# Patient Record
Sex: Female | Born: 1955
Health system: Southern US, Community
[De-identification: ages and names within clinical notes are randomized; demographics above are authoritative.]

## PROBLEM LIST (undated history)

## (undated) DIAGNOSIS — M8580 Other specified disorders of bone density and structure, unspecified site: Principal | ICD-10-CM

## (undated) DIAGNOSIS — I10 Essential (primary) hypertension: Secondary | ICD-10-CM

## (undated) DIAGNOSIS — R7303 Prediabetes: Secondary | ICD-10-CM

## (undated) HISTORY — DX: Essential (primary) hypertension: I10

## (undated) HISTORY — DX: Prediabetes: R73.03

## (undated) HISTORY — DX: Other specified disorders of bone density and structure, unspecified site: M85.80

---

## 1986-08-15 HISTORY — PX: TUBAL LIGATION: SHX77

## 2000-12-12 ENCOUNTER — Encounter: Payer: Self-pay | Admitting: Obstetrics and Gynecology

## 2000-12-12 ENCOUNTER — Ambulatory Visit (HOSPITAL_COMMUNITY): Admission: RE | Admit: 2000-12-12 | Discharge: 2000-12-12 | Payer: Self-pay | Admitting: Obstetrics and Gynecology

## 2004-01-28 ENCOUNTER — Ambulatory Visit (HOSPITAL_COMMUNITY): Admission: RE | Admit: 2004-01-28 | Discharge: 2004-01-28 | Payer: Self-pay | Admitting: Obstetrics and Gynecology

## 2005-03-30 ENCOUNTER — Ambulatory Visit (HOSPITAL_COMMUNITY): Admission: RE | Admit: 2005-03-30 | Discharge: 2005-03-30 | Payer: Self-pay | Admitting: Obstetrics and Gynecology

## 2006-05-29 ENCOUNTER — Ambulatory Visit (HOSPITAL_COMMUNITY): Admission: RE | Admit: 2006-05-29 | Discharge: 2006-05-29 | Payer: Self-pay | Admitting: Obstetrics and Gynecology

## 2007-06-27 ENCOUNTER — Ambulatory Visit (HOSPITAL_COMMUNITY): Admission: RE | Admit: 2007-06-27 | Discharge: 2007-06-27 | Payer: Self-pay | Admitting: Obstetrics and Gynecology

## 2012-03-19 ENCOUNTER — Other Ambulatory Visit (HOSPITAL_COMMUNITY): Payer: Self-pay | Admitting: Obstetrics and Gynecology

## 2012-03-19 DIAGNOSIS — Z1231 Encounter for screening mammogram for malignant neoplasm of breast: Secondary | ICD-10-CM

## 2012-04-11 ENCOUNTER — Ambulatory Visit (HOSPITAL_COMMUNITY)
Admission: RE | Admit: 2012-04-11 | Discharge: 2012-04-11 | Disposition: A | Discharge status: Home / Self Care | Payer: 59 | Source: Ambulatory Visit | Attending: Obstetrics and Gynecology | Admitting: Obstetrics and Gynecology

## 2012-04-11 DIAGNOSIS — Z1231 Encounter for screening mammogram for malignant neoplasm of breast: Secondary | ICD-10-CM | POA: Insufficient documentation

## 2013-08-24 ENCOUNTER — Encounter (HOSPITAL_BASED_OUTPATIENT_CLINIC_OR_DEPARTMENT_OTHER): Payer: Self-pay | Admitting: Emergency Medicine

## 2013-08-24 ENCOUNTER — Emergency Department (HOSPITAL_BASED_OUTPATIENT_CLINIC_OR_DEPARTMENT_OTHER)
Admission: EM | Admit: 2013-08-24 | Discharge: 2013-08-24 | Disposition: A | Discharge status: Home / Self Care | Payer: 59 | Attending: Emergency Medicine | Admitting: Emergency Medicine

## 2013-08-24 DIAGNOSIS — R51 Headache: Secondary | ICD-10-CM | POA: Insufficient documentation

## 2013-08-24 DIAGNOSIS — J019 Acute sinusitis, unspecified: Secondary | ICD-10-CM | POA: Insufficient documentation

## 2013-08-24 DIAGNOSIS — J3489 Other specified disorders of nose and nasal sinuses: Secondary | ICD-10-CM | POA: Insufficient documentation

## 2013-08-24 DIAGNOSIS — J329 Chronic sinusitis, unspecified: Secondary | ICD-10-CM

## 2013-08-24 DIAGNOSIS — F17200 Nicotine dependence, unspecified, uncomplicated: Secondary | ICD-10-CM | POA: Insufficient documentation

## 2013-08-24 MED ORDER — AMOXICILLIN-POT CLAVULANATE 875-125 MG PO TABS: 1.0000 | ORAL_TABLET | Freq: Two times a day (BID) | ORAL | Status: DC

## 2013-08-24 NOTE — Progress Notes (Signed)
Received Incoming call from [facility] .Pharmacist requesting MD clarification on medication directions.Upon reviewing EPIC it was noted [facility] had [facility] not [facility] ED. This CM placed a call to [facility]-and spoke with RN-Marva she was provided with information on the situation and contact number for [facility]. She will follow up with MD regarding Pharmacist  Question.This CM called Raquel Sarna pharmacist [phone] and explained patient had gone to [facility] and not [facility] ED.Pharmacist aware message passed along to [facility].No further CM needs identified.

## 2013-08-24 NOTE — ED Notes (Signed)
Patient c/o non=productive cough/headache & ear pain since Monday, voice scratchy. URI symptoms  throughout December but symptoms seemed to go away, but started feeing bad again on Monday. Has taken alkaseltzer, sudafed & mucinex but no relief.

## 2013-08-24 NOTE — ED Provider Notes (Signed)
CSN: [id number]     Arrival date & time 08/24/13  1120 History   First MD Initiated Contact with Patient 08/24/13 1139     Chief Complaint  Patient presents with  . Cough   (Consider location/radiation/quality/duration/timing/severity/associated sxs/prior Treatment) HPI Comments: 58 yo female with smoking hx presents with sinus congestion, ear pain and cough since Christmas.  Nothing improves, constant, tried sudafed, no current abx, no DM.  Mild drainage. No sick contacts. Pt has been going to work and tolerating po.  Mild HA frontal.   Patient is a 58 y.o. female presenting with cough. The history is provided by the patient.  Cough Associated symptoms: headaches (gradual onset) and rhinorrhea   Associated symptoms: no chills, no fever and no rash     History reviewed. No pertinent past medical history. History reviewed. No pertinent past surgical history. No family history on file. History  Substance Use Topics  . Smoking status: Current Every Day Smoker  . Smokeless tobacco: Not on file  . Alcohol Use: No   OB History   Grav Para Term Preterm Abortions TAB SAB Ect Mult Living                 Review of Systems  Constitutional: Negative for fever and chills.  HENT: Positive for congestion, rhinorrhea and sinus pressure.   Eyes: Negative for visual disturbance.  Respiratory: Positive for cough.   Gastrointestinal: Negative for vomiting.  Genitourinary: Negative for dysuria and flank pain.  Musculoskeletal: Negative for neck pain and neck stiffness.  Skin: Negative for rash.  Neurological: Positive for headaches (gradual onset). Negative for light-headedness.    Allergies  Review of patient's allergies indicates no known allergies.  Home Medications   Current Outpatient Rx  Name  Route  Sig  Dispense  Refill  . Esomeprazole Magnesium (NEXIUM PO)   Oral   Take by mouth as needed.         Marland Kitchen amoxicillin-clavulanate (AUGMENTIN) 875-125 MG per tablet   Oral   Take 1  tablet by mouth 2 (two) times daily. One po bid x 7 days   20 tablet   0    BP 169/90  Pulse 88  Temp(Src) 98.4 F (36.9 C) (Oral)  Resp 20  Ht 5\' 1"  (1.549 m)  Wt 118 lb (53.524 kg)  BMI 22.31 kg/m2  SpO2 98% Physical Exam  Nursing note and vitals reviewed. Constitutional: She is oriented to person, place, and time. She appears well-developed and well-nourished.  HENT:  Head: Normocephalic and atraumatic.  Congested, maxillary sinus tenderness  Eyes: Conjunctivae are normal. Right eye exhibits no discharge. Left eye exhibits no discharge.  Neck: Normal range of motion. Neck supple. No tracheal deviation present.  Cardiovascular: Normal rate and regular rhythm.   Pulmonary/Chest: Effort normal.  Musculoskeletal: She exhibits no edema.  Neurological: She is alert and oriented to person, place, and time.  Skin: Skin is warm. No rash noted.  Psychiatric: She has a normal mood and affect.    ED Course  Procedures (including critical care time) Labs Review Labs Reviewed - No data to display Imaging Review No results found.  EKG Interpretation   None       MDM   1. Sinusitis    Well appearing. Sinusitis.  With > 2 wks sxs discussed r/b of abx and use of salt solution, pt agrees.  Results and differential diagnosis were discussed with the patient. Close follow up outpatient was discussed, patient comfortable with the plan.  Diagnosis: above    Mariea Clonts, MD 08/24/13 [phone]

## 2013-08-24 NOTE — Discharge Instructions (Signed)
If you were given medicines take as directed.  If you are on coumadin or contraceptives realize their levels and effectiveness is altered by many different medicines.  If you have any reaction (rash, tongues swelling, other) to the medicines stop taking and see a physician.   Please follow up as directed and return to the ER or see a physician for new or worsening symptoms.  Thank you. Try homemade netty pott.    Sinus Headache A sinus headache happens when your sinuses become clogged or puffy (swollen). Sinus headaches can be mild or severe. HOME CARE  Take your medicines (antibiotics) as told. Finish them even if you start to feel better.  Only take medicine as told by your doctor.  Use a nose spray if you feel stuffed up (congested). GET HELP RIGHT AWAY IF:  You have a fever.  You have trouble seeing.  You suddenly have pain in your face or head.  You start to twitch or shake (seizure).  You are confused.  You get headaches more than once a week.  Light or sound bothers you.  You feel sick to your stomach (nauseous) or throw up (vomit).  Your headaches do not get better with treatment. MAKE SURE YOU:  Understand these instructions.  Will watch your condition.  Will get help right away if you are not doing well or get worse. Document Released: 12/01/2010 Document Revised: 10/24/2011 Document Reviewed: 12/01/2010 [facility] Patient Information 2014 [place], [place].

## 2013-10-29 ENCOUNTER — Telehealth: Payer: Self-pay

## 2013-10-29 NOTE — Telephone Encounter (Signed)
Left message for call back Non-identifiable   NEW PATIENT  Pap [facility]- 04/11/12-negative Flu Td

## 2013-10-30 ENCOUNTER — Ambulatory Visit (INDEPENDENT_AMBULATORY_CARE_PROVIDER_SITE_OTHER): Payer: 59 | Admitting: Family Medicine

## 2013-10-30 ENCOUNTER — Encounter: Payer: Self-pay | Admitting: Family Medicine

## 2013-10-30 ENCOUNTER — Encounter: Payer: Self-pay | Admitting: General Practice

## 2013-10-30 VITALS — BP 150/82 | HR 84 | Temp 98.2°F | Resp 16 | Ht 61.5 in | Wt 123.5 lb

## 2013-10-30 DIAGNOSIS — Z1231 Encounter for screening mammogram for malignant neoplasm of breast: Secondary | ICD-10-CM

## 2013-10-30 DIAGNOSIS — Z780 Asymptomatic menopausal state: Secondary | ICD-10-CM

## 2013-10-30 DIAGNOSIS — Z1211 Encounter for screening for malignant neoplasm of colon: Secondary | ICD-10-CM

## 2013-10-30 DIAGNOSIS — I10 Essential (primary) hypertension: Secondary | ICD-10-CM

## 2013-10-30 DIAGNOSIS — F17200 Nicotine dependence, unspecified, uncomplicated: Secondary | ICD-10-CM | POA: Insufficient documentation

## 2013-10-30 LAB — CBC WITH DIFFERENTIAL/PLATELET
BASOS PCT: 0.6 % (ref 0.0–3.0)
Basophils Absolute: 0 10*3/uL (ref 0.0–0.1)
EOS PCT: 1.2 % (ref 0.0–5.0)
Eosinophils Absolute: 0.1 10*3/uL (ref 0.0–0.7)
HEMATOCRIT: 42.2 % (ref 36.0–46.0)
HEMOGLOBIN: 14.1 g/dL (ref 12.0–15.0)
Lymphocytes Relative: 23.7 % (ref 12.0–46.0)
Lymphs Abs: 1.8 10*3/uL (ref 0.7–4.0)
MCHC: 33.4 g/dL (ref 30.0–36.0)
MCV: 90.4 fl (ref 78.0–100.0)
MONO ABS: 0.5 10*3/uL (ref 0.1–1.0)
Monocytes Relative: 7 % (ref 3.0–12.0)
NEUTROS ABS: 5 10*3/uL (ref 1.4–7.7)
NEUTROS PCT: 67.5 % (ref 43.0–77.0)
Platelets: 278 10*3/uL (ref 150.0–400.0)
RBC: 4.67 Mil/uL (ref 3.87–5.11)
RDW: 13.4 % (ref 11.5–14.6)
WBC: 7.4 10*3/uL (ref 4.5–10.5)

## 2013-10-30 LAB — BASIC METABOLIC PANEL
BUN: 14 mg/dL (ref 6–23)
CALCIUM: 9.3 mg/dL (ref 8.4–10.5)
CO2: 30 mEq/L (ref 19–32)
CREATININE: 0.8 mg/dL (ref 0.4–1.2)
Chloride: 103 mEq/L (ref 96–112)
GFR: 85.08 mL/min (ref >60.00)
Glucose, Bld: 100 mg/dL — ABNORMAL HIGH (ref 70–99)
Potassium: 4 mEq/L (ref 3.5–5.1)
SODIUM: 139 meq/L (ref 135–145)

## 2013-10-30 LAB — TSH: TSH: 0.83 u[IU]/mL (ref 0.35–5.50)

## 2013-10-30 LAB — HEPATIC FUNCTION PANEL
ALT: 14 U/L (ref 0–35)
AST: 19 U/L (ref 0–37)
Albumin: 4.4 g/dL (ref 3.5–5.2)
Alkaline Phosphatase: 69 U/L (ref 39–117)
BILIRUBIN TOTAL: 0.4 mg/dL (ref 0.3–1.2)
Bilirubin, Direct: 0 mg/dL (ref 0.0–0.3)
Total Protein: 7.2 g/dL (ref 6.0–8.3)

## 2013-10-30 LAB — LIPID PANEL
CHOL/HDL RATIO: 3
Cholesterol: 196 mg/dL (ref 0–200)
HDL: 68.9 mg/dL (ref >39.00)
LDL CALC: 112 mg/dL — AB (ref 0–99)
TRIGLYCERIDES: 77 mg/dL (ref 0.0–149.0)
VLDL: 15.4 mg/dL (ref 0.0–40.0)

## 2013-10-30 MED ORDER — HYDROCHLOROTHIAZIDE 12.5 MG PO TABS: 12.5000 mg | ORAL_TABLET | Freq: Every day | ORAL | Status: DC

## 2013-10-30 NOTE — Progress Notes (Signed)
Pre visit review using our clinic review tool, if applicable. No additional management support is needed unless otherwise documented below in the visit note. 

## 2013-10-30 NOTE — Progress Notes (Signed)
   Subjective:    Patient ID: Joy Olson, female    DOB: [birth year], 57 y.o.   MRN: [id number]  HPI New to establish.  Previous MD- Menzer, Laurin Coder  HTN- chronic problem, last BP noted 169/90 in Jan.  + family hx of HTN and CAD.  + tobacco use.  No CP, SOB, HAs, visual changes, edema.  Pt initially reports, 'i'm not going to lie, i don't want to take meds'.  Tobacco- chronic problem, not interested in quitting.  1/2 ppd, x40 yrs  Health maintenance- due for colonoscopy, mammo, DEXA.  UTD on pap.   Review of Systems For ROS see HPI     Objective:   Physical Exam  Vitals reviewed. Constitutional: She is oriented to person, place, and time. She appears well-developed and well-nourished. No distress.  HENT:  Head: Normocephalic and atraumatic.  Eyes: Conjunctivae and EOM are normal. Pupils are equal, round, and reactive to light.  Neck: Normal range of motion. Neck supple. No thyromegaly present.  Cardiovascular: Normal rate, regular rhythm, normal heart sounds and intact distal pulses.   No murmur heard. Pulmonary/Chest: Effort normal and breath sounds normal. No respiratory distress.  Abdominal: Soft. She exhibits no distension. There is no tenderness.  Musculoskeletal: She exhibits no edema.  Lymphadenopathy:    She has no cervical adenopathy.  Neurological: She is alert and oriented to person, place, and time.  Skin: Skin is warm and dry.  Psychiatric: She has a normal mood and affect. Her behavior is normal.          Assessment & Plan:

## 2013-10-30 NOTE — Patient Instructions (Signed)
Follow up in 3-4 weeks to recheck BP and potassium We'll notify you of your lab results and make any changes if needed Start the HCTZ daily- typically in the AM Try and stop smoking- this will improve your BP Try and find a stress outlet- this will also improve BP We'll call you with your colonoscopy consultation and your mammo/bone density appt Call with any questions or concerns Welcome!  We're glad to have you!

## 2013-10-30 NOTE — Assessment & Plan Note (Signed)
New.  Pt initially resistant to starting meds but discussed possibility of MI or CVA- particularly in the setting of smoking and family hx.  Pt then agreeable to start low dose med.  Will get baseline labs, start HCTZ, and monitor closely for improvement.  Pt expressed understanding and is in agreement w/ plan.

## 2013-10-30 NOTE — Assessment & Plan Note (Signed)
New.  Check Vit D level.  Get baseline DEXA.  Encouraged daily Ca and Vit D supplementation.

## 2013-10-30 NOTE — Telephone Encounter (Signed)
Unable to reach patient pre visit.  

## 2013-10-30 NOTE — Assessment & Plan Note (Signed)
New.  Pt has been smoking for 40 yrs and is not interested in quitting at this time.  Discussed that this is likely contributing to HTN and is putting her at increased risk for MI/CVA.  Will follow at future visits.

## 2013-10-31 ENCOUNTER — Telehealth: Payer: Self-pay | Admitting: Family Medicine

## 2013-10-31 NOTE — Telephone Encounter (Signed)
Relevant patient education assigned to patient using Emmi. ° °

## 2013-11-03 LAB — VITAMIN D 1,25 DIHYDROXY
Vitamin D 1, 25 (OH)2 Total: 59 pg/mL (ref 18–72)
Vitamin D3 1, 25 (OH)2: 59 pg/mL

## 2013-11-04 ENCOUNTER — Encounter: Payer: Self-pay | Admitting: General Practice

## 2013-12-02 ENCOUNTER — Encounter: Payer: Self-pay | Admitting: Family Medicine

## 2013-12-04 ENCOUNTER — Ambulatory Visit: Payer: 59 | Admitting: Family Medicine

## 2013-12-17 ENCOUNTER — Encounter (INDEPENDENT_AMBULATORY_CARE_PROVIDER_SITE_OTHER): Payer: Self-pay

## 2013-12-17 ENCOUNTER — Ambulatory Visit
Admission: RE | Admit: 2013-12-17 | Discharge: 2013-12-17 | Disposition: A | Discharge status: Home / Self Care | Payer: 59 | Source: Ambulatory Visit | Attending: Family Medicine | Admitting: Family Medicine

## 2013-12-17 ENCOUNTER — Encounter: Payer: Self-pay | Admitting: General Practice

## 2013-12-17 DIAGNOSIS — Z780 Asymptomatic menopausal state: Secondary | ICD-10-CM

## 2013-12-17 DIAGNOSIS — Z1231 Encounter for screening mammogram for malignant neoplasm of breast: Secondary | ICD-10-CM

## 2014-02-06 ENCOUNTER — Encounter: Payer: Self-pay | Admitting: General Practice

## 2014-02-06 ENCOUNTER — Ambulatory Visit: Payer: 59 | Admitting: Family Medicine

## 2014-02-06 ENCOUNTER — Ambulatory Visit (INDEPENDENT_AMBULATORY_CARE_PROVIDER_SITE_OTHER): Payer: 59 | Admitting: Family Medicine

## 2014-02-06 ENCOUNTER — Encounter: Payer: Self-pay | Admitting: Family Medicine

## 2014-02-06 VITALS — BP 150/90 | HR 92 | Temp 98.7°F | Wt 122.0 lb

## 2014-02-06 DIAGNOSIS — I10 Essential (primary) hypertension: Secondary | ICD-10-CM

## 2014-02-06 LAB — BASIC METABOLIC PANEL
BUN: 11 mg/dL (ref 6–23)
CALCIUM: 9.6 mg/dL (ref 8.4–10.5)
CO2: 26 meq/L (ref 19–32)
CREATININE: 0.8 mg/dL (ref 0.4–1.2)
Chloride: 104 mEq/L (ref 96–112)
GFR: 82.45 mL/min (ref >60.00)
GLUCOSE: 89 mg/dL (ref 70–99)
Potassium: 3.8 mEq/L (ref 3.5–5.1)
SODIUM: 139 meq/L (ref 135–145)

## 2014-02-06 MED ORDER — LOSARTAN POTASSIUM 50 MG PO TABS: 50.0000 mg | ORAL_TABLET | Freq: Every day | ORAL | Status: DC

## 2014-02-06 NOTE — Progress Notes (Signed)
Pre visit review using our clinic review tool, if applicable. No additional management support is needed unless otherwise documented below in the visit note. 

## 2014-02-06 NOTE — Patient Instructions (Addendum)
Follow up in 1 month to recheck BP STOP the HCTZ START the losartan daily Call with any questions or concerns Have a great 4th of July!!

## 2014-02-06 NOTE — Assessment & Plan Note (Signed)
Chronic problem.  Not well controlled on HCTZ and also having side effects (dizziness).  STOP HCTZ.  Start Losartan.  Reviewed possible side effects and appropriate use.  Reviewed supportive care and red flags that should prompt return.  Pt expressed understanding and is in agreement w/ plan.

## 2014-02-06 NOTE — Progress Notes (Signed)
   Subjective:    Patient ID: Joy Olson, female    DOB: 08-18-1955, 58 y.o.   MRN: [id number]  Hypertension   HTN- noted at last visit and started on HCTZ.  Pt reports she has been taking med regularly but it is causing dizziness.  Pt reports BP is typically elevated in the AM.  No CP, SOB, HAs, visual changes, edema.   Review of Systems For ROS see HPI     Objective:   Physical Exam  Vitals reviewed. Constitutional: She is oriented to person, place, and time. She appears well-developed and well-nourished. No distress.  HENT:  Head: Normocephalic and atraumatic.  Eyes: Conjunctivae and EOM are normal. Pupils are equal, round, and reactive to light.  Neck: Normal range of motion. Neck supple. No thyromegaly present.  Cardiovascular: Normal rate, regular rhythm, normal heart sounds and intact distal pulses.   No murmur heard. Pulmonary/Chest: Effort normal and breath sounds normal. No respiratory distress.  Abdominal: Soft. She exhibits no distension. There is no tenderness.  Musculoskeletal: She exhibits no edema.  Lymphadenopathy:    She has no cervical adenopathy.  Neurological: She is alert and oriented to person, place, and time.  Skin: Skin is warm and dry.  Psychiatric: She has a normal mood and affect. Her behavior is normal.          Assessment & Plan:

## 2014-06-19 ENCOUNTER — Encounter: Payer: Self-pay | Admitting: General Practice

## 2014-06-19 ENCOUNTER — Encounter: Payer: Self-pay | Admitting: Family Medicine

## 2014-06-19 ENCOUNTER — Ambulatory Visit (INDEPENDENT_AMBULATORY_CARE_PROVIDER_SITE_OTHER): Payer: 59 | Admitting: Family Medicine

## 2014-06-19 VITALS — BP 150/90 | HR 88 | Temp 98.2°F | Resp 16 | Wt 121.5 lb

## 2014-06-19 DIAGNOSIS — I10 Essential (primary) hypertension: Secondary | ICD-10-CM

## 2014-06-19 LAB — BASIC METABOLIC PANEL
BUN: 10 mg/dL (ref 6–23)
CHLORIDE: 104 meq/L (ref 96–112)
CO2: 29 mEq/L (ref 19–32)
CREATININE: 0.7 mg/dL (ref 0.4–1.2)
Calcium: 9.3 mg/dL (ref 8.4–10.5)
GFR: 91.92 mL/min (ref >60.00)
Glucose, Bld: 94 mg/dL (ref 70–99)
Potassium: 3.9 mEq/L (ref 3.5–5.1)
Sodium: 139 mEq/L (ref 135–145)

## 2014-06-19 LAB — CBC WITH DIFFERENTIAL/PLATELET
BASOS PCT: 0.7 % (ref 0.0–3.0)
Basophils Absolute: 0 10*3/uL (ref 0.0–0.1)
EOS PCT: 1.7 % (ref 0.0–5.0)
Eosinophils Absolute: 0.1 10*3/uL (ref 0.0–0.7)
HCT: 42.5 % (ref 36.0–46.0)
Hemoglobin: 14.3 g/dL (ref 12.0–15.0)
LYMPHS PCT: 25.8 % (ref 12.0–46.0)
Lymphs Abs: 1.6 10*3/uL (ref 0.7–4.0)
MCHC: 33.5 g/dL (ref 30.0–36.0)
MCV: 89.4 fl (ref 78.0–100.0)
MONOS PCT: 6.5 % (ref 3.0–12.0)
Monocytes Absolute: 0.4 10*3/uL (ref 0.1–1.0)
NEUTROS PCT: 65.3 % (ref 43.0–77.0)
Neutro Abs: 4.1 10*3/uL (ref 1.4–7.7)
PLATELETS: 294 10*3/uL (ref 150.0–400.0)
RBC: 4.75 Mil/uL (ref 3.87–5.11)
RDW: 13.6 % (ref 11.5–15.5)
WBC: 6.3 10*3/uL (ref 4.0–10.5)

## 2014-06-19 MED ORDER — LOSARTAN POTASSIUM 100 MG PO TABS: 100.0000 mg | ORAL_TABLET | Freq: Every day | ORAL | Status: DC

## 2014-06-19 NOTE — Progress Notes (Signed)
Pre visit review using our clinic review tool, if applicable. No additional management support is needed unless otherwise documented below in the visit note. 

## 2014-06-19 NOTE — Patient Instructions (Signed)
Follow up in 4-6 weeks to recheck BP We'll notify you of your lab results Start the Losartan 100mg  daily Try and quit smoking- you can do this!  (and it will help your BP) Try and get regular exercise- this will also improve your BP Call with any questions or concerns Happy Thanksgiving!!!

## 2014-06-19 NOTE — Assessment & Plan Note (Signed)
BP still not at goal.  Pt has been out of BP meds but reports even when taking regularly, BP was not lower than '150s-160s'.  Asymptomatic.  Check labs.  Increase Losartan to 100mg  daily and follow closely.  If no improvement at next visit will consider adding Amlodipine.  Pt expressed understanding and is in agreement w/ plan.

## 2014-06-19 NOTE — Progress Notes (Signed)
   Subjective:    Patient ID: Joy Olson, female    DOB: Aug 20, 1955, 58 y.o.   MRN: [id number]  HPI HTN- chronic problem, BP not at goal.  Has been checking BP at home, 'it's been coming down', typically 150s.  Taking OTC cough and cold meds.  No CP, SOB, HAs, visual changes, edema.   Review of Systems For ROS see HPI     Objective:   Physical Exam  Constitutional: She is oriented to person, place, and time. She appears well-developed and well-nourished. No distress.  HENT:  Head: Normocephalic and atraumatic.  Eyes: Conjunctivae and EOM are normal. Pupils are equal, round, and reactive to light.  Neck: Normal range of motion. Neck supple. No thyromegaly present.  Cardiovascular: Normal rate, regular rhythm, normal heart sounds and intact distal pulses.   No murmur heard. Pulmonary/Chest: Effort normal and breath sounds normal. No respiratory distress.  Abdominal: Soft. She exhibits no distension. There is no tenderness.  Musculoskeletal: She exhibits no edema.  Lymphadenopathy:    She has no cervical adenopathy.  Neurological: She is alert and oriented to person, place, and time.  Skin: Skin is warm and dry.  Psychiatric: She has a normal mood and affect. Her behavior is normal.  Vitals reviewed.         Assessment & Plan:

## 2014-07-30 ENCOUNTER — Ambulatory Visit: Payer: 59 | Admitting: Family Medicine

## 2014-09-03 ENCOUNTER — Ambulatory Visit (INDEPENDENT_AMBULATORY_CARE_PROVIDER_SITE_OTHER): Payer: 59 | Admitting: Family Medicine

## 2014-09-03 ENCOUNTER — Encounter: Payer: Self-pay | Admitting: Family Medicine

## 2014-09-03 ENCOUNTER — Encounter: Payer: Self-pay | Admitting: General Practice

## 2014-09-03 VITALS — BP 130/80 | HR 87 | Temp 98.1°F | Resp 16 | Wt 121.2 lb

## 2014-09-03 DIAGNOSIS — I10 Essential (primary) hypertension: Secondary | ICD-10-CM

## 2014-09-03 LAB — BASIC METABOLIC PANEL
BUN: 9 mg/dL (ref 6–23)
CO2: 31 mEq/L (ref 19–32)
Calcium: 9.8 mg/dL (ref 8.4–10.5)
Chloride: 104 mEq/L (ref 96–112)
Creatinine, Ser: 0.74 mg/dL (ref 0.40–1.20)
GFR: 86.14 mL/min (ref >60.00)
Glucose, Bld: 111 mg/dL — ABNORMAL HIGH (ref 70–99)
POTASSIUM: 4.1 meq/L (ref 3.5–5.1)
Sodium: 138 mEq/L (ref 135–145)

## 2014-09-03 NOTE — Progress Notes (Signed)
   Subjective:    Patient ID: Joy Olson, female    DOB: 1956-06-28, 59 y.o.   MRN: [id number]  HPI HTN- chronic problem.  Pt's Losartan was increased to 100mg  at last visit b/c BPs were still 150s.  Pt reports BP will still elevate in times of stress.  No CP, SOB, HAs, visual changes, edema.  Still smoking.   Review of Systems For ROS see HPI     Objective:   Physical Exam  Constitutional: She is oriented to person, place, and time. She appears well-developed and well-nourished. No distress.  HENT:  Head: Normocephalic and atraumatic.  Eyes: Conjunctivae and EOM are normal. Pupils are equal, round, and reactive to light.  Neck: Normal range of motion. Neck supple. No thyromegaly present.  Cardiovascular: Normal rate, regular rhythm, normal heart sounds and intact distal pulses.   No murmur heard. Pulmonary/Chest: Effort normal and breath sounds normal. No respiratory distress.  Abdominal: Soft. She exhibits no distension. There is no tenderness.  Musculoskeletal: She exhibits no edema.  Lymphadenopathy:    She has no cervical adenopathy.  Neurological: She is alert and oriented to person, place, and time.  Skin: Skin is warm and dry.  Psychiatric: She has a normal mood and affect. Her behavior is normal.  Vitals reviewed.         Assessment & Plan:

## 2014-09-03 NOTE — Assessment & Plan Note (Signed)
Much improved since increasing Losartan to 100mg  daily.  Asymptomatic.  Check BMP.  No anticipated med changes.  Will follow.

## 2014-09-03 NOTE — Patient Instructions (Signed)
Schedule your complete physical in 6 months We'll notify you of your lab results and make any changes if needed Keep up the good work!  You look great!! Happy New Year!!!

## 2014-09-03 NOTE — Progress Notes (Signed)
Pre visit review using our clinic review tool, if applicable. No additional management support is needed unless otherwise documented below in the visit note. 

## 2014-10-27 ENCOUNTER — Other Ambulatory Visit: Payer: Self-pay | Admitting: Family Medicine

## 2014-10-27 NOTE — Telephone Encounter (Signed)
Med filled.  

## 2015-04-06 ENCOUNTER — Other Ambulatory Visit: Payer: Self-pay | Admitting: Family Medicine

## 2015-04-06 NOTE — Telephone Encounter (Signed)
Medication filled to pharmacy as requested.   

## 2015-07-01 ENCOUNTER — Telehealth: Payer: Self-pay

## 2015-07-02 ENCOUNTER — Encounter: Payer: Self-pay | Admitting: Family Medicine

## 2015-07-02 ENCOUNTER — Ambulatory Visit (INDEPENDENT_AMBULATORY_CARE_PROVIDER_SITE_OTHER): Payer: 59 | Admitting: Family Medicine

## 2015-07-02 ENCOUNTER — Other Ambulatory Visit (HOSPITAL_COMMUNITY)
Admission: RE | Admit: 2015-07-02 | Discharge: 2015-07-02 | Disposition: A | Discharge status: Home / Self Care | Payer: 59 | Source: Ambulatory Visit | Attending: Family Medicine | Admitting: Family Medicine

## 2015-07-02 VITALS — BP 124/82 | HR 84 | Temp 98.0°F | Resp 16 | Ht 62.0 in | Wt 122.4 lb

## 2015-07-02 DIAGNOSIS — Z124 Encounter for screening for malignant neoplasm of cervix: Secondary | ICD-10-CM

## 2015-07-02 DIAGNOSIS — Z01419 Encounter for gynecological examination (general) (routine) without abnormal findings: Secondary | ICD-10-CM | POA: Insufficient documentation

## 2015-07-02 DIAGNOSIS — Z0000 Encounter for general adult medical examination without abnormal findings: Secondary | ICD-10-CM | POA: Diagnosis not present

## 2015-07-02 DIAGNOSIS — Z1211 Encounter for screening for malignant neoplasm of colon: Secondary | ICD-10-CM | POA: Diagnosis not present

## 2015-07-02 DIAGNOSIS — Z1151 Encounter for screening for human papillomavirus (HPV): Secondary | ICD-10-CM | POA: Diagnosis present

## 2015-07-02 LAB — CBC WITH DIFFERENTIAL/PLATELET
BASOS ABS: 0.1 10*3/uL (ref 0.0–0.1)
Basophils Relative: 0.6 % (ref 0.0–3.0)
EOS ABS: 0.1 10*3/uL (ref 0.0–0.7)
Eosinophils Relative: 0.6 % (ref 0.0–5.0)
HCT: 43 % (ref 36.0–46.0)
Hemoglobin: 14.4 g/dL (ref 12.0–15.0)
LYMPHS ABS: 1.9 10*3/uL (ref 0.7–4.0)
Lymphocytes Relative: 22.1 % (ref 12.0–46.0)
MCHC: 33.5 g/dL (ref 30.0–36.0)
MCV: 89.7 fl (ref 78.0–100.0)
MONO ABS: 0.5 10*3/uL (ref 0.1–1.0)
Monocytes Relative: 6 % (ref 3.0–12.0)
NEUTROS PCT: 70.7 % (ref 43.0–77.0)
Neutro Abs: 6.1 10*3/uL (ref 1.4–7.7)
Platelets: 296 10*3/uL (ref 150.0–400.0)
RBC: 4.79 Mil/uL (ref 3.87–5.11)
RDW: 13.1 % (ref 11.5–15.5)
WBC: 8.7 10*3/uL (ref 4.0–10.5)

## 2015-07-02 LAB — HEPATIC FUNCTION PANEL
ALBUMIN: 4.5 g/dL (ref 3.5–5.2)
ALK PHOS: 73 U/L (ref 39–117)
ALT: 12 U/L (ref 0–35)
AST: 15 U/L (ref 0–37)
Bilirubin, Direct: 0.1 mg/dL (ref 0.0–0.3)
TOTAL PROTEIN: 7.2 g/dL (ref 6.0–8.3)
Total Bilirubin: 0.5 mg/dL (ref 0.2–1.2)

## 2015-07-02 LAB — BASIC METABOLIC PANEL
BUN: 10 mg/dL (ref 6–23)
CHLORIDE: 103 meq/L (ref 96–112)
CO2: 29 meq/L (ref 19–32)
Calcium: 9.6 mg/dL (ref 8.4–10.5)
Creatinine, Ser: 0.69 mg/dL (ref 0.40–1.20)
GFR: 93.11 mL/min (ref >60.00)
GLUCOSE: 99 mg/dL (ref 70–99)
POTASSIUM: 4.2 meq/L (ref 3.5–5.1)
Sodium: 140 mEq/L (ref 135–145)

## 2015-07-02 LAB — LIPID PANEL
CHOLESTEROL: 229 mg/dL — AB (ref 0–200)
HDL: 68.9 mg/dL (ref >39.00)
LDL CALC: 143 mg/dL — AB (ref 0–99)
NonHDL: 159.86
TRIGLYCERIDES: 85 mg/dL (ref 0.0–149.0)
Total CHOL/HDL Ratio: 3
VLDL: 17 mg/dL (ref 0.0–40.0)

## 2015-07-02 LAB — TSH: TSH: 0.96 u[IU]/mL (ref 0.35–4.50)

## 2015-07-02 LAB — VITAMIN D 25 HYDROXY (VIT D DEFICIENCY, FRACTURES): VITD: 40.08 ng/mL (ref 30.00–100.00)

## 2015-07-02 MED ORDER — ESOMEPRAZOLE MAGNESIUM 40 MG PO CPDR: 40.0000 mg | DELAYED_RELEASE_CAPSULE | Freq: Every day | ORAL | Status: DC

## 2015-07-02 NOTE — Progress Notes (Signed)
Pre visit review using our clinic review tool, if applicable. No additional management support is needed unless otherwise documented below in the visit note. 

## 2015-07-02 NOTE — Patient Instructions (Signed)
Follow up in 6 months to recheck BP (with your new doctor) We'll notify you of your lab results and make any changes if needed We'll call you with your GI appt for colonoscopy consultation Call and [facility] Keep up the good work on healthy diet and regular exercise- you look great! Quit smoking!!! Call with any questions or concerns Happy Holidays!!! (I'll miss you!!)

## 2015-07-02 NOTE — Progress Notes (Signed)
   Subjective:    Patient ID: Joy Olson, female    DOB: 1956-05-22, 59 y.o.   MRN: [id number]  HPI CPE- due for pap, mammo, colonoscopy.  Declines flu shot.   Review of Systems Patient reports no vision/ hearing changes, adenopathy,fever, weight change,  persistant/recurrent hoarseness , swallowing issues, chest pain, palpitations, edema, persistant/recurrent cough, hemoptysis, dyspnea (rest/exertional/paroxysmal nocturnal), gastrointestinal bleeding (melena, rectal bleeding), abdominal pain, significant heartburn, bowel changes, GU symptoms (dysuria, hematuria, incontinence), Gyn symptoms (abnormal  bleeding, pain),  syncope, focal weakness, memory loss, numbness & tingling, skin/hair/nail changes, abnormal bruising or bleeding, anxiety, or depression.     Objective:   Physical Exam  General Appearance:    Alert, cooperative, no distress, appears stated age  Head:    Normocephalic, without obvious abnormality, atraumatic  Eyes:    PERRL, conjunctiva/corneas clear, EOM's intact, fundi    benign, both eyes  Ears:    Normal TM's and external ear canals, both ears  Nose:   Nares normal, septum midline, mucosa normal, no drainage    or sinus tenderness  Throat:   Lips, mucosa, and tongue normal; teeth and gums normal  Neck:   Supple, symmetrical, trachea midline, no adenopathy;    Thyroid: no enlargement/tenderness/nodules  Back:     Symmetric, no curvature, ROM normal, no CVA tenderness  Lungs:     Clear to auscultation bilaterally, respirations unlabored  Chest Wall:    No tenderness or deformity   Heart:    Regular rate and rhythm, S1 and S2 normal, no murmur, rub   or gallop  Breast Exam:    No tenderness, masses, or nipple abnormality  Abdomen:     Soft, non-tender, bowel sounds active all four quadrants,    no masses, no organomegaly  Genitalia:    External genitalia normal, cervix normal in appearance, no CMT, uterus in normal size and position, adnexa w/out mass or  tenderness, mucosa pink and moist, no lesions or discharge present  Rectal:    Normal external appearance  Extremities:   Extremities normal, atraumatic, no cyanosis or edema  Pulses:   2+ and symmetric all extremities  Skin:   Skin color, texture, turgor normal, no rashes or lesions  Lymph nodes:   Cervical, supraclavicular, and axillary nodes normal  Neurologic:   CNII-XII intact, normal strength, sensation and reflexes    throughout          Assessment & Plan:

## 2015-07-02 NOTE — Assessment & Plan Note (Signed)
Pap collected. 

## 2015-07-02 NOTE — Assessment & Plan Note (Addendum)
Pt's PE WNL.  Due for mammo- pt plans to schedule.  Referral for colonoscopy placed.  Pap collected today.  EKG done as baseline.  Check labs.  Anticipatory guidance provided.

## 2015-07-06 LAB — CYTOLOGY - PAP

## 2015-08-26 NOTE — Telephone Encounter (Signed)
Pre-Visit information 

## 2015-09-09 ENCOUNTER — Other Ambulatory Visit: Payer: Self-pay | Admitting: Family Medicine

## 2015-09-09 NOTE — Telephone Encounter (Signed)
Medication filled to pharmacy as requested.   

## 2015-12-22 ENCOUNTER — Telehealth: Payer: Self-pay | Admitting: Family Medicine

## 2015-12-23 ENCOUNTER — Ambulatory Visit: Payer: 59 | Admitting: Family

## 2016-01-20 ENCOUNTER — Ambulatory Visit (INDEPENDENT_AMBULATORY_CARE_PROVIDER_SITE_OTHER): Payer: 59 | Admitting: Family

## 2016-01-20 ENCOUNTER — Encounter: Payer: Self-pay | Admitting: Family

## 2016-01-20 VITALS — BP 130/90 | HR 74 | Temp 98.3°F | Resp 16 | Ht 62.0 in | Wt 123.4 lb

## 2016-01-20 DIAGNOSIS — F17200 Nicotine dependence, unspecified, uncomplicated: Secondary | ICD-10-CM

## 2016-01-20 DIAGNOSIS — I10 Essential (primary) hypertension: Secondary | ICD-10-CM | POA: Diagnosis not present

## 2016-01-20 DIAGNOSIS — E2839 Other primary ovarian failure: Secondary | ICD-10-CM | POA: Diagnosis not present

## 2016-01-20 DIAGNOSIS — Z0000 Encounter for general adult medical examination without abnormal findings: Secondary | ICD-10-CM

## 2016-01-20 LAB — BASIC METABOLIC PANEL
BUN: 12 mg/dL (ref 6–23)
CALCIUM: 9.8 mg/dL (ref 8.4–10.5)
CO2: 30 meq/L (ref 19–32)
CREATININE: 0.73 mg/dL (ref 0.40–1.20)
Chloride: 104 mEq/L (ref 96–112)
GFR: 87.08 mL/min (ref >60.00)
Glucose, Bld: 106 mg/dL — ABNORMAL HIGH (ref 70–99)
Potassium: 5 mEq/L (ref 3.5–5.1)
SODIUM: 140 meq/L (ref 135–145)

## 2016-01-20 MED ORDER — LOSARTAN POTASSIUM 100 MG PO TABS: 100.0000 mg | ORAL_TABLET | Freq: Every day | ORAL | Status: DC

## 2016-01-20 NOTE — Assessment & Plan Note (Signed)
Follow up bp check 130/90. Continue losartan, obtain bmet.

## 2016-01-20 NOTE — Assessment & Plan Note (Signed)
>  5 min spent counseling pt on importance of smoking cessation and tools (patch, zyban, chantix). She will consider quitting, not currently ready.

## 2016-01-20 NOTE — Progress Notes (Signed)
Pre visit review using our clinic review tool, if applicable. No additional management support is needed unless otherwise documented below in the visit note. 

## 2016-01-20 NOTE — Patient Instructions (Signed)
Please complete lab work prior to leaving. Let me know when you are ready to quit smoking.   Follow up in 3 months.

## 2016-01-20 NOTE — Progress Notes (Signed)
   Subjective:    Patient ID: Joy Olson, female    DOB: [birth year], 60 y.o.   MRN: [id number]  HPI  Joy Olson is a 60 yr old female who presents today for follow up.    HTN- She is maintained on losartan 100mg . Denies CP/SOB or swelling.   BP Readings from Last 3 Encounters:  01/20/16 156/92  07/02/15 124/82  09/03/14 130/80   10 cig a day since age 45.  Quit while pregnant x 2.  Quit again 10 years ago for a year.  Husband is a smoker.  Not motivated to quit currently.    Review of Systems See HPI  Past Medical History  Diagnosis Date  . Hypertension      Social History   Social History  . Marital Status: Married    Spouse Name: N/A  . Number of Children: N/A  . Years of Education: N/A   Occupational History  . Not on file.   Social History Main Topics  . Smoking status: Current Every Day Smoker -- 0.50 packs/day for 40 years    Types: Cigarettes  . Smokeless tobacco: Not on file  . Alcohol Use: No  . Drug Use: No  . Sexual Activity: Yes   Other Topics Concern  . Not on file   Social History Narrative    No past surgical history on file.  Family History  Problem Relation Age of Onset  . Heart disease Mother   . Heart disease Maternal Grandmother   . Heart attack Maternal Grandfather     No Known Allergies  Current Outpatient Prescriptions on File Prior to Visit  Medication Sig Dispense Refill  . esomeprazole (NEXIUM) 40 MG capsule Take 1 capsule (40 mg total) by mouth daily at 12 noon. 30 capsule 6  . losartan (COZAAR) 100 MG tablet TAKE 1 TABLET BY MOUTH DAILY 30 tablet 3   No current facility-administered medications on file prior to visit.    BP 156/92 mmHg  Pulse 74  Temp(Src) 98.3 F (36.8 C) (Oral)  Resp 16  Ht 5\' 2"  (1.575 m)  Wt 123 lb 6.4 oz (55.974 kg)  BMI 22.56 kg/m2  SpO2 98%       Objective:   Physical Exam  Constitutional: She is oriented to person, place, and time. She appears well-developed and  well-nourished.  HENT:  Head: Normocephalic and atraumatic.  Cardiovascular: Normal rate, regular rhythm and normal heart sounds.   No murmur heard. Pulmonary/Chest: Effort normal and breath sounds normal. No respiratory distress. She has no wheezes.  Neurological: She is alert and oriented to person, place, and time.  Psychiatric: She has a normal mood and affect. Her behavior is normal. Judgment and thought content normal.          Assessment & Plan:

## 2016-01-29 ENCOUNTER — Encounter: Payer: Self-pay | Admitting: Gastroenterology

## 2016-02-02 ENCOUNTER — Ambulatory Visit (HOSPITAL_BASED_OUTPATIENT_CLINIC_OR_DEPARTMENT_OTHER)
Admission: RE | Admit: 2016-02-02 | Discharge: 2016-02-02 | Disposition: A | Discharge status: Home / Self Care | Payer: 59 | Source: Ambulatory Visit | Attending: Family | Admitting: Family

## 2016-02-02 ENCOUNTER — Encounter: Payer: Self-pay | Admitting: Family

## 2016-02-02 ENCOUNTER — Telehealth: Payer: Self-pay | Admitting: Family

## 2016-02-02 DIAGNOSIS — Z780 Asymptomatic menopausal state: Secondary | ICD-10-CM | POA: Insufficient documentation

## 2016-02-02 DIAGNOSIS — M8580 Other specified disorders of bone density and structure, unspecified site: Secondary | ICD-10-CM

## 2016-02-02 DIAGNOSIS — M8588 Other specified disorders of bone density and structure, other site: Secondary | ICD-10-CM | POA: Insufficient documentation

## 2016-02-02 DIAGNOSIS — E2839 Other primary ovarian failure: Secondary | ICD-10-CM | POA: Insufficient documentation

## 2016-02-02 DIAGNOSIS — Z0000 Encounter for general adult medical examination without abnormal findings: Secondary | ICD-10-CM | POA: Diagnosis not present

## 2016-02-02 DIAGNOSIS — Z1231 Encounter for screening mammogram for malignant neoplasm of breast: Secondary | ICD-10-CM | POA: Diagnosis not present

## 2016-02-02 DIAGNOSIS — M810 Age-related osteoporosis without current pathological fracture: Secondary | ICD-10-CM | POA: Insufficient documentation

## 2016-02-02 DIAGNOSIS — Z720 Tobacco use: Secondary | ICD-10-CM | POA: Insufficient documentation

## 2016-02-02 HISTORY — DX: Other specified disorders of bone density and structure, unspecified site: M85.80

## 2016-02-02 MED ORDER — CALCIUM CARBONATE-VITAMIN D 600-400 MG-UNIT PO TABS: 1.0000 | ORAL_TABLET | Freq: Two times a day (BID) | ORAL | Status: DC

## 2016-02-02 NOTE — Telephone Encounter (Signed)
See mychart.  

## 2016-03-02 NOTE — Telephone Encounter (Signed)
Complete

## 2016-03-11 ENCOUNTER — Ambulatory Visit (AMBULATORY_SURGERY_CENTER): Payer: Self-pay

## 2016-03-11 VITALS — Ht 61.0 in | Wt 121.0 lb

## 2016-03-11 DIAGNOSIS — Z1211 Encounter for screening for malignant neoplasm of colon: Secondary | ICD-10-CM

## 2016-03-11 MED ORDER — NA SULFATE-K SULFATE-MG SULF 17.5-3.13-1.6 GM/177ML PO SOLN: 1.0000 | Freq: Once | ORAL | 0 refills | Status: AC

## 2016-03-11 NOTE — Progress Notes (Signed)
No egg or soy allergy.  No previous complications from anesthesia. No home O2. No diet meds. 

## 2016-03-25 ENCOUNTER — Encounter: Payer: Self-pay | Admitting: Gastroenterology

## 2016-03-25 ENCOUNTER — Ambulatory Visit (AMBULATORY_SURGERY_CENTER): Payer: 59 | Admitting: Gastroenterology

## 2016-03-25 VITALS — BP 136/63 | HR 62 | Temp 98.9°F | Resp 16 | Ht 61.0 in | Wt 121.0 lb

## 2016-03-25 DIAGNOSIS — K219 Gastro-esophageal reflux disease without esophagitis: Secondary | ICD-10-CM | POA: Diagnosis not present

## 2016-03-25 DIAGNOSIS — I10 Essential (primary) hypertension: Secondary | ICD-10-CM | POA: Diagnosis not present

## 2016-03-25 DIAGNOSIS — Z1211 Encounter for screening for malignant neoplasm of colon: Secondary | ICD-10-CM | POA: Diagnosis present

## 2016-03-25 DIAGNOSIS — D125 Benign neoplasm of sigmoid colon: Secondary | ICD-10-CM | POA: Diagnosis not present

## 2016-03-25 MED ORDER — SODIUM CHLORIDE 0.9 % IV SOLN: 500.0000 mL | INTRAVENOUS | Status: DC

## 2016-03-25 NOTE — Patient Instructions (Signed)
Colon polyp x 1 removed today. Handouts given on polyps,hemorrhoids. No aspirin, ibuprofen, naproxen, or other non-steriodal anti-inflammatory medications for 5 days.  Result letter in your mail in 2-3 weeks. Call us with any questions or concerns. Thank you!  YOU HAD AN ENDOSCOPIC PROCEDURE TODAY AT [facility]:   Refer to the procedure report that was given to you for any specific questions about what was found during the examination.  If the procedure report does not answer your questions, please call your gastroenterologist to clarify.  If you requested that your care partner not be given the details of your procedure findings, then the procedure report has been included in a sealed envelope for you to review at your convenience later.  YOU SHOULD EXPECT: Some feelings of bloating in the abdomen. Passage of more gas than usual.  Walking can help get rid of the air that was put into your GI tract during the procedure and reduce the bloating. If you had a lower endoscopy (such as a colonoscopy or flexible sigmoidoscopy) you may notice spotting of blood in your stool or on the toilet paper. If you underwent a bowel prep for your procedure, you may not have a normal bowel movement for a few days.  Please Note:  You might notice some irritation and congestion in your nose or some drainage.  This is from the oxygen used during your procedure.  There is no need for concern and it should clear up in a day or so.  SYMPTOMS TO REPORT IMMEDIATELY:   Following lower endoscopy (colonoscopy or flexible sigmoidoscopy):  Excessive amounts of blood in the stool  Significant tenderness or worsening of abdominal pains  Swelling of the abdomen that is new, acute  Fever of 100F or higher   For urgent or emergent issues, a gastroenterologist can be reached at any hour by calling [phone].   DIET: Your first meal following the procedure should be a small meal and then it is ok to progress  to your normal diet. Heavy or fried foods are harder to digest and may make you feel nauseous or bloated.  Likewise, meals heavy in dairy and vegetables can increase bloating.  Drink plenty of fluids but you should avoid alcoholic beverages for 24 hours.  ACTIVITY:  You should plan to take it easy for the rest of today and you should NOT DRIVE or use heavy machinery until tomorrow (because of the sedation medicines used during the test).    FOLLOW UP: Our staff will call the number listed on your records the next business day following your procedure to check on you and address any questions or concerns that you may have regarding the information given to you following your procedure. If we do not reach you, we will leave a message.  However, if you are feeling well and you are not experiencing any problems, there is no need to return our call.  We will assume that you have returned to your regular daily activities without incident.  If any biopsies were taken you will be contacted by phone or by letter within the next 1-3 weeks.  Please call us at [phone] if you have not heard about the biopsies in 3 weeks.    SIGNATURES/CONFIDENTIALITY: You and/or your care partner have signed paperwork which will be entered into your electronic medical record.  These signatures attest to the fact that that the information above on your After Visit Summary has been reviewed and is understood.  Full responsibility of the confidentiality of this discharge information lies with you and/or your care-partner.

## 2016-03-25 NOTE — Progress Notes (Signed)
Report to PACU, RN, vss, BBS= Clear.  

## 2016-03-25 NOTE — Op Note (Signed)
[facility] Patient Name: Joy Olson Procedure Date: 03/25/2016 7:38 AM MRN: [id number] Endoscopist: Sidell. Loletha Carrow , MD Age: 60 Referring MD:  Date of Birth: 09-Dec-1955 Gender: Female Account #: [id number] Procedure:                Colonoscopy Indications:              Screening for colorectal malignant neoplasm, This                            is the patient's first colonoscopy Medicines:                Monitored Anesthesia Care Procedure:                Pre-Anesthesia Assessment:                           - Prior to the procedure, a History and Physical                            was performed, and patient medications and                            allergies were reviewed. The patient's tolerance of                            previous anesthesia was also reviewed. The risks                            and benefits of the procedure and the sedation                            options and risks were discussed with the patient.                            All questions were answered, and informed consent                            was obtained. Prior Anticoagulants: The patient has                            taken no previous anticoagulant or antiplatelet                            agents. ASA Grade Assessment: II - A patient with                            mild systemic disease. After reviewing the risks                            and benefits, the patient was deemed in                            satisfactory condition to undergo the procedure.  After obtaining informed consent, the colonoscope                            was passed under direct vision. Throughout the                            procedure, the patient's blood pressure, pulse, and                            oxygen saturations were monitored continuously. The                            Model CF-HQ190L [id number]) scope was introduced                            through the anus and  advanced to the the cecum,                            identified by appendiceal orifice and ileocecal                            valve. The colonoscopy was performed without                            difficulty. The patient tolerated the procedure                            well. The quality of the bowel preparation was                            excellent. The ileocecal valve, appendiceal                            orifice, and rectum were photographed. The quality                            of the bowel preparation was evaluated using the                            BBPS [facility] Bowel Preparation Scale) with scores                            of: Right Colon = 3, Transverse Colon = 3 and Left                            Colon = 3 (entire mucosa seen well with no residual                            staining, small fragments of stool or opaque                            liquid). The total BBPS score equals 9. The bowel  preparation used was SUPREP. Scope In: 8:08:19 AM Scope Out: 8:23:00 AM Scope Withdrawal Time: 0 hours 11 minutes 44 seconds  Total Procedure Duration: 0 hours 14 minutes 41 seconds  Findings:                 The perianal and digital rectal examinations were                            normal.                           A 6 mm polyp was found in the mid sigmoid colon.                            The polyp was sessile. The polyp was removed with a                            cold snare. Resection and retrieval were complete.                           Internal hemorrhoids were found during                            retroflexion. The hemorrhoids were medium-sized and                            Grade I (internal hemorrhoids that do not prolapse).                           The exam was otherwise without abnormality on                            direct and retroflexion views. Complications:            No immediate complications. Estimated Blood Loss:      Estimated blood loss: none. Impression:               - One 6 mm polyp in the mid sigmoid colon, removed                            with a cold snare. Resected and retrieved.                           - Internal hemorrhoids.                           - The examination was otherwise normal on direct                            and retroflexion views. Recommendation:           - Patient has a contact number available for                            emergencies. The signs and symptoms of potential  delayed complications were discussed with the                            patient. Return to normal activities tomorrow.                            Written discharge instructions were provided to the                            patient.                           - Resume previous diet.                           - Continue present medications.                           - No aspirin, ibuprofen, naproxen, or other                            non-steroidal anti-inflammatory drugs for 5 days                            after polyp removal.                           - Await pathology results.                           - Repeat colonoscopy is recommended for                            surveillance. The colonoscopy date will be                            determined after pathology results from today's                            exam become available for review. Henry L. Loletha Carrow, MD 03/25/2016 8:27:42 AM This report has been signed electronically.

## 2016-03-25 NOTE — Progress Notes (Signed)
Called to room to assist during endoscopic procedure.  Patient ID and intended procedure confirmed with present staff. Received instructions for my participation in the procedure from the performing physician.  

## 2016-03-28 ENCOUNTER — Telehealth: Payer: Self-pay | Admitting: *Deleted

## 2016-03-28 NOTE — Telephone Encounter (Signed)
  Follow up Call-  Call back number 03/25/2016  Post procedure Call Back phone  # [phone]  Permission to leave phone message Yes  Some recent data might be hidden     Patient questions:  Do you have a fever, pain , or abdominal swelling? No. Pain Score  0 *  Have you tolerated food without any problems? Yes.    Have you been able to return to your normal activities? Yes.    Do you have any questions about your discharge instructions: Diet   No. Medications  No. Follow up visit  No.  Do you have questions or concerns about your Care? No.  Actions: * If pain score is 4 or above: No action needed, pain <4.

## 2016-03-31 ENCOUNTER — Encounter: Payer: Self-pay | Admitting: Gastroenterology

## 2016-03-31 DIAGNOSIS — H524 Presbyopia: Secondary | ICD-10-CM | POA: Diagnosis not present

## 2016-03-31 DIAGNOSIS — H5213 Myopia, bilateral: Secondary | ICD-10-CM | POA: Diagnosis not present

## 2016-04-22 ENCOUNTER — Ambulatory Visit (INDEPENDENT_AMBULATORY_CARE_PROVIDER_SITE_OTHER): Payer: 59 | Admitting: Family

## 2016-04-22 ENCOUNTER — Encounter: Payer: Self-pay | Admitting: Family

## 2016-04-22 DIAGNOSIS — K219 Gastro-esophageal reflux disease without esophagitis: Secondary | ICD-10-CM | POA: Insufficient documentation

## 2016-04-22 DIAGNOSIS — Z23 Encounter for immunization: Secondary | ICD-10-CM

## 2016-04-22 DIAGNOSIS — M8580 Other specified disorders of bone density and structure, unspecified site: Secondary | ICD-10-CM

## 2016-04-22 DIAGNOSIS — I10 Essential (primary) hypertension: Secondary | ICD-10-CM

## 2016-04-22 NOTE — Progress Notes (Signed)
Pre visit review using our clinic review tool, if applicable. No additional management support is needed unless otherwise documented below in the visit note. 

## 2016-04-22 NOTE — Progress Notes (Signed)
Subjective:    Patient ID: Joy Olson, female    DOB: [birth year], 60 y.o.   MRN: [id number]  HPI  Joy Olson is a 60 yr old female who presents today for follow up of her hypertension.  Pt continues losartan. Denies cp/sob/swelling. Reports good med compliance.   BP Readings from Last 3 Encounters:  04/22/16 134/86  03/25/16 136/63  01/20/16 130/90   GERD- reports that she uses nexium prn.    Osteopenia- walks. Not taking caltrate but will start.   Review of Systems See HPI  Past Medical History:  Diagnosis Date  . Hypertension   . Osteopenia 02/02/2016     Social History   Social History  . Marital status: Married    Spouse name: N/A  . Number of children: 2  . Years of education: N/A   Occupational History  . Not on file.   Social History Main Topics  . Smoking status: Current Every Day Smoker    Packs/day: 0.50    Years: 40.00    Types: Cigarettes  . Smokeless tobacco: Never Used  . Alcohol use No  . Drug use: No  . Sexual activity: Yes   Other Topics Concern  . 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 TUBAL LIGATION  1988    Family History  Problem Relation Age of Onset  . Heart disease Mother     hx chf, died at age17  . Heart disease Maternal Grandmother   . Heart attack Maternal Grandfather   . Colon cancer Neg Hx     No Known Allergies  Current Outpatient Prescriptions on File Prior to Visit  Medication Sig Dispense Refill  . Calcium Carbonate-Vitamin D (CALTRATE 600+D) 600-400 MG-UNIT tablet Take 1 tablet by mouth 2 (two) times daily.    Marland Kitchen  esomeprazole (NEXIUM) 40 MG capsule Take 1 capsule (40 mg total) by mouth daily at 12 noon. 30 capsule 6  . losartan (COZAAR) 100 MG tablet Take 1 tablet (100 mg total) by mouth daily. 90 tablet 1   Current Facility-Administered Medications on File Prior to Visit  Medication Dose Route Frequency Provider Last Rate Last Dose  . 0.9 %  sodium chloride infusion  500 mL Intravenous Continuous Nelida Meuse III, MD        BP 134/86 (BP Location: Right Arm, Cuff Size: Normal)   Pulse 88   Temp 98.3 F (36.8 C) (Oral)   Resp 16   Ht 5\' 2"  (1.575 m)   Wt 120 lb 9.6 oz (54.7 kg)   LMP 08/15/2009   SpO2 98% Comment: room air  BMI 22.06 kg/m       Objective:   Physical Exam  Constitutional: She is oriented to person, place, and time. She appears well-developed and well-nourished.  Cardiovascular: Normal rate, regular rhythm and normal heart sounds.   No murmur heard. Pulmonary/Chest: Effort normal and breath sounds normal. No respiratory distress. She has no wheezes.  Neurological: She is alert and oriented to person, place, and  time.  Psychiatric: She has a normal mood and affect. Her behavior is normal. Judgment and thought content normal.          Assessment & Plan:  Flu shot today

## 2016-04-22 NOTE — Assessment & Plan Note (Addendum)
BP stable on current medications. Continue same.  

## 2016-04-22 NOTE — Assessment & Plan Note (Signed)
Encouraged patient to continue regular walking, add caltrate supplement.

## 2016-04-22 NOTE — Assessment & Plan Note (Signed)
Stable with prn use of nexium.  

## 2016-07-25 ENCOUNTER — Encounter: Payer: Self-pay | Admitting: Family

## 2016-07-25 ENCOUNTER — Ambulatory Visit (INDEPENDENT_AMBULATORY_CARE_PROVIDER_SITE_OTHER): Payer: 59 | Admitting: Family

## 2016-07-25 VITALS — BP 167/86 | HR 71 | Temp 98.5°F | Resp 16 | Ht 62.0 in | Wt 120.2 lb

## 2016-07-25 DIAGNOSIS — I10 Essential (primary) hypertension: Secondary | ICD-10-CM

## 2016-07-25 DIAGNOSIS — Z0000 Encounter for general adult medical examination without abnormal findings: Secondary | ICD-10-CM | POA: Diagnosis not present

## 2016-07-25 DIAGNOSIS — R599 Enlarged lymph nodes, unspecified: Secondary | ICD-10-CM | POA: Diagnosis not present

## 2016-07-25 DIAGNOSIS — Z23 Encounter for immunization: Secondary | ICD-10-CM

## 2016-07-25 LAB — BASIC METABOLIC PANEL
BUN: 10 mg/dL (ref 6–23)
CALCIUM: 9.3 mg/dL (ref 8.4–10.5)
CO2: 28 meq/L (ref 19–32)
CREATININE: 0.72 mg/dL (ref 0.40–1.20)
Chloride: 104 mEq/L (ref 96–112)
GFR: 88.32 mL/min (ref >60.00)
Glucose, Bld: 115 mg/dL — ABNORMAL HIGH (ref 70–99)
Potassium: 3.9 mEq/L (ref 3.5–5.1)
SODIUM: 140 meq/L (ref 135–145)

## 2016-07-25 LAB — URINALYSIS, ROUTINE W REFLEX MICROSCOPIC
BILIRUBIN URINE: NEGATIVE
HGB URINE DIPSTICK: NEGATIVE
KETONES UR: NEGATIVE
LEUKOCYTES UA: NEGATIVE
NITRITE: NEGATIVE
PH: 6.5 (ref 5.0–8.0)
Specific Gravity, Urine: <=1.005 — AB (ref 1.000–1.030)
Total Protein, Urine: NEGATIVE
Urine Glucose: NEGATIVE
Urobilinogen, UA: 0.2 (ref 0.0–1.0)

## 2016-07-25 LAB — HEPATIC FUNCTION PANEL
ALBUMIN: 4.3 g/dL (ref 3.5–5.2)
ALK PHOS: 66 U/L (ref 39–117)
ALT: 10 U/L (ref 0–35)
AST: 17 U/L (ref 0–37)
BILIRUBIN DIRECT: 0.1 mg/dL (ref 0.0–0.3)
TOTAL PROTEIN: 6.9 g/dL (ref 6.0–8.3)
Total Bilirubin: 0.5 mg/dL (ref 0.2–1.2)

## 2016-07-25 LAB — LIPID PANEL
CHOL/HDL RATIO: 4
Cholesterol: 202 mg/dL — ABNORMAL HIGH (ref 0–200)
HDL: 55.6 mg/dL (ref >39.00)
LDL Cholesterol: 131 mg/dL — ABNORMAL HIGH (ref 0–99)
NONHDL: 145.96
Triglycerides: 77 mg/dL (ref 0.0–149.0)
VLDL: 15.4 mg/dL (ref 0.0–40.0)

## 2016-07-25 LAB — CBC WITH DIFFERENTIAL/PLATELET
BASOS ABS: 0 10*3/uL (ref 0.0–0.1)
Basophils Relative: 0.5 % (ref 0.0–3.0)
EOS ABS: 0.1 10*3/uL (ref 0.0–0.7)
Eosinophils Relative: 0.6 % (ref 0.0–5.0)
HEMATOCRIT: 40.4 % (ref 36.0–46.0)
HEMOGLOBIN: 13.8 g/dL (ref 12.0–15.0)
LYMPHS PCT: 14.4 % (ref 12.0–46.0)
Lymphs Abs: 1.3 10*3/uL (ref 0.7–4.0)
MCHC: 34 g/dL (ref 30.0–36.0)
MCV: 88.3 fl (ref 78.0–100.0)
MONOS PCT: 5.8 % (ref 3.0–12.0)
Monocytes Absolute: 0.5 10*3/uL (ref 0.1–1.0)
NEUTROS ABS: 7.2 10*3/uL (ref 1.4–7.7)
Neutrophils Relative %: 78.7 % — ABNORMAL HIGH (ref 43.0–77.0)
PLATELETS: 328 10*3/uL (ref 150.0–400.0)
RBC: 4.58 Mil/uL (ref 3.87–5.11)
RDW: 13.4 % (ref 11.5–15.5)
WBC: 9.1 10*3/uL (ref 4.0–10.5)

## 2016-07-25 LAB — TSH: TSH: 0.77 u[IU]/mL (ref 0.35–4.50)

## 2016-07-25 MED ORDER — AMLODIPINE BESYLATE 5 MG PO TABS: 5.0000 mg | ORAL_TABLET | Freq: Every day | ORAL | 3 refills | Status: DC

## 2016-07-25 NOTE — Addendum Note (Signed)
Addended by: Kelle Darting A on: 07/25/2016 11:22 AM   Modules accepted: Orders

## 2016-07-25 NOTE — Progress Notes (Signed)
Subjective:    Patient ID: Joy Olson, female    DOB: April 07, 1956, 60 y.o.   MRN: [id number]  HPI  Joy Olson is a 60 yr old female who presents today for complete physical.  Immunizations: due for tetanus.   Diet: healthy Exercise: walking regularly Colonoscopy:  8/17 Dexa:  6/17. Pap Smear: 07/02/15 Mammogram:  6/17 Tobacco abuse has cut down to 10 cig a day.   Reports recent cough/chest congestion.  Reports that her symptoms are improving.   HTN-  Reports good compliance with losartan.   Review of Systems  Constitutional: Negative for unexpected weight change.  HENT: Positive for rhinorrhea. Negative for hearing loss.   Eyes: Negative for visual disturbance.  Respiratory: Positive for cough.   Cardiovascular: Negative for leg swelling.  Gastrointestinal: Negative for constipation and diarrhea.  Genitourinary: Negative for dysuria and frequency.  Musculoskeletal: Negative for arthralgias and myalgias.  Skin: Negative for rash.  Neurological: Negative for numbness.  Hematological: Negative for adenopathy.  Psychiatric/Behavioral:       Denies depression/anxiety       Past Medical History:  Diagnosis Date  . Hypertension   . Osteopenia 02/02/2016     Social History   Social History  . Marital status: Married    Spouse name: N/A  . Number of children: 2  . Years of education: N/A   Occupational History  . Not on file.   Social History Main Topics  . Smoking status: Current Every Day Smoker    Packs/day: 0.50    Years: 40.00    Types: Cigarettes  . Smokeless tobacco: Never Used  . Alcohol use No  . Drug use: No  . Sexual activity: Yes   Other Topics Concern  . 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 Worked there for 66 yrs   Married 29 years- husband is Office manager   Enjoys  working in the yard (gypsy garden) reading, watching movies   Grew up in [place]       Past Surgical History:  Procedure Laterality Date  . TUBAL LIGATION  1988    Family History  Problem Relation Age of Onset  . Heart disease Mother     hx chf, died at age74  . Heart disease Maternal Grandmother   . Heart attack Maternal Grandfather   . Colon cancer Neg Hx     No Known Allergies  Current Outpatient Prescriptions on File Prior to Visit  Medication Sig Dispense Refill  . Calcium Carbonate-Vitamin D (CALTRATE 600+D) 600-400 MG-UNIT tablet Take 1 tablet by mouth 2 (two) times daily.    Marland Kitchen esomeprazole (NEXIUM) 40 MG capsule Take 1 capsule (40 mg total) by mouth daily at 12 noon. 30 capsule 6  . losartan (COZAAR) 100 MG tablet Take 1 tablet (100 mg total) by mouth daily. 90 tablet 1   Current Facility-Administered Medications on File Prior to Visit  Medication Dose Route Frequency Provider Last Rate Last Dose  . 0.9 %  sodium chloride infusion  500 mL Intravenous Continuous Nelida Meuse III, MD        BP (!) 167/86 (BP Location: Right Arm, Cuff Size: Normal)   Pulse 71   Temp 98.5 F (36.9 C) (Oral)   Resp 16   Ht 5\' 2"  (1.575  m)   Wt 120 lb 3.2 oz (54.5 kg)   LMP 08/15/2009   SpO2 100% Comment: room air  BMI 21.98 kg/m    Objective:   Physical Exam Physical Exam  Constitutional: She is oriented to person, place, and time. She appears well-developed and well-nourished. No distress.  HENT:  Head: Normocephalic and atraumatic.  Right Ear: Tympanic membrane and ear canal normal.  Left Ear: Tympanic membrane and ear canal normal.  Mouth/Throat: Oropharynx is clear and moist.  Eyes: Pupils are equal, round, and reactive to light. No scleral icterus.  Neck: Normal range of motion. No thyromegaly present.  Cardiovascular: Normal rate and regular rhythm.   No murmur heard. Pulmonary/Chest: Effort normal and breath sounds normal. No respiratory distress. He has no  wheezes. She has no rales. She exhibits no tenderness.  Abdominal: Soft. Bowel sounds are normal. She exhibits no distension and no mass. There is no tenderness. There is no rebound and no guarding.  Musculoskeletal: She exhibits no edema.  Lymphadenopathy:    She has one left anterior cervical lymph node which feels enlarge.  Neurological: She is alert and oriented to person, place, and time. She has normal patellar reflexes. She exhibits normal muscle tone. Coordination normal.  Skin: Skin is warm and dry.  Psychiatric: She has a normal mood and affect. Her behavior is normal. Judgment and thought content normal.  Breasts: Examined lying Right: Without masses, retractions, discharge or axillary adenopathy.  Left: Without masses, retractions, discharge or axillary adenopathy.       Assessment & Plan:   Enlarged cervical lymph node- ? Due to recent URI. Will re-evaluate in 2 weeks. If still enlarged, consider ultrasound         Assessment & Plan:  Preventative Care- discussed continuing healthy diet, exercise and smoking cessation. Tdap today. EKG tracing is personally reviewed.  EKG notes NSR.  No acute changes.  I advised pt to contact her insurance re: coverage for zostavax.

## 2016-07-25 NOTE — Patient Instructions (Signed)
Continue healthy diet and exercise. Please begin amlodipine once daily for your blood pressure. Continue to work on quitting smoking.

## 2016-07-25 NOTE — Assessment & Plan Note (Signed)
Uncontrolled. Add amlodipine.

## 2016-07-25 NOTE — Progress Notes (Signed)
Pre visit review using our clinic review tool, if applicable. No additional management support is needed unless otherwise documented below in the visit note. 

## 2016-07-26 ENCOUNTER — Encounter: Payer: Self-pay | Admitting: Family

## 2016-07-26 ENCOUNTER — Other Ambulatory Visit (INDEPENDENT_AMBULATORY_CARE_PROVIDER_SITE_OTHER): Payer: 59

## 2016-07-26 DIAGNOSIS — R739 Hyperglycemia, unspecified: Secondary | ICD-10-CM

## 2016-07-26 DIAGNOSIS — R7303 Prediabetes: Secondary | ICD-10-CM | POA: Insufficient documentation

## 2016-07-26 HISTORY — DX: Prediabetes: R73.03

## 2016-07-26 LAB — HEMOGLOBIN A1C: Hgb A1c MFr Bld: 6 % (ref 4.6–6.5)

## 2016-07-26 NOTE — Telephone Encounter (Signed)
Left message for pt to return my call.

## 2016-07-26 NOTE — Telephone Encounter (Signed)
Please let pt know that her lab work came back showing borderline DM2.  Please work on avoiding concentrated sweets, getting regular exercise and limiting white fluffy carbs.

## 2016-07-27 NOTE — Telephone Encounter (Signed)
Attempted to reach pt and left message to check mychart acct. Message sent. 

## 2016-07-27 NOTE — Telephone Encounter (Signed)
Patient returning call.

## 2016-08-17 ENCOUNTER — Encounter: Payer: Self-pay | Admitting: Family

## 2016-08-17 ENCOUNTER — Ambulatory Visit (INDEPENDENT_AMBULATORY_CARE_PROVIDER_SITE_OTHER): Payer: 59 | Admitting: Family

## 2016-08-17 VITALS — BP 170/98 | HR 76 | Temp 97.8°F | Resp 16 | Ht 62.0 in | Wt 123.0 lb

## 2016-08-17 DIAGNOSIS — R221 Localized swelling, mass and lump, neck: Secondary | ICD-10-CM

## 2016-08-17 DIAGNOSIS — K219 Gastro-esophageal reflux disease without esophagitis: Secondary | ICD-10-CM

## 2016-08-17 DIAGNOSIS — I10 Essential (primary) hypertension: Secondary | ICD-10-CM

## 2016-08-17 MED ORDER — ESOMEPRAZOLE MAGNESIUM 40 MG PO CPDR: 40.0000 mg | DELAYED_RELEASE_CAPSULE | Freq: Every day | ORAL | 1 refills | Status: DC

## 2016-08-17 NOTE — Progress Notes (Signed)
Subjective:    Patient ID: Joy Olson, female    DOB: [birth year], 61 y.o.   MRN: [id number]  HPI  Mr. Barletta is a 61 yr old female who presents today for follow up of her hypertension.  Reports that she did not start amlodipine due to hope that her BP will come down with her upcoming retirement and not wanting to be "on a lot of pills."  She denies CP/SOB or swelling.  BP Readings from Last 3 Encounters:  08/17/16 (!) 170/98  07/25/16 (!) 167/86  04/22/16 134/86   GERD-reports that this is stable, only uses nexium on a prn basis.  Requests refill.  Enlarged cervical Lymph node.    Review of Systems    see HPI  Past Medical History:  Diagnosis Date  . Borderline diabetes 07/26/2016  . Hypertension   . Osteopenia 02/02/2016     Social History   Social History  . Marital status: Married    Spouse name: N/A  . Number of children: 2  . Years of education: N/A   Occupational History  . Not on file.   Social History Main Topics  . Smoking status: Current Every Day Smoker    Packs/day: 0.50    Years: 40.00    Types: Cigarettes  . Smokeless tobacco: Never Used  . Alcohol use No  . Drug use: No  . Sexual activity: Yes   Other Topics Concern  . 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 Worked there for 28 yrs   Married 62 years- husband is Office manager   Enjoys working in the yard (gypsy garden) reading, watching movies   Grew up in [place]       Past Surgical History:  Procedure Laterality Date  . TUBAL LIGATION  1988    Family History  Problem Relation Age of Onset  . Heart disease Mother     hx chf, died at age14  . Heart disease Maternal Grandmother   . Heart attack Maternal Grandfather   . Colon cancer Neg Hx     No Known Allergies  Current Outpatient Prescriptions on  File Prior to Visit  Medication Sig Dispense Refill  . Calcium Carbonate-Vitamin D (CALTRATE 600+D) 600-400 MG-UNIT tablet Take 1 tablet by mouth 2 (two) times daily.    Marland Kitchen esomeprazole (NEXIUM) 40 MG capsule Take 1 capsule (40 mg total) by mouth daily at 12 noon. 30 capsule 6  . losartan (COZAAR) 100 MG tablet Take 1 tablet (100 mg total) by mouth daily. 90 tablet 1  . amLODipine (NORVASC) 5 MG tablet Take 1 tablet (5 mg total) by mouth daily. (Patient not taking: Reported on 08/17/2016) 30 tablet 3   Current Facility-Administered Medications on File Prior to Visit  Medication Dose Route Frequency Provider Last Rate Last Dose  . 0.9 %  sodium chloride infusion  500 mL Intravenous Continuous Nelida Meuse III, MD        BP (!) 170/98 (BP Location: Right Arm, Cuff Size: Normal)   Pulse 76   Temp 97.8 F (36.6 C) (Oral)   Resp 16   Ht 5\' 2"  (1.575 m)   Wt 123 lb (55.8 kg)   LMP 08/15/2009   SpO2 97% Comment: room air  BMI 22.50 kg/m  Objective:   Physical Exam  Constitutional: She appears well-developed and well-nourished.  Neck:  Enlarged ?lymph node again noted left upper anterior/cervical region  Cardiovascular: Normal rate, regular rhythm and normal heart sounds.   No murmur heard. Pulmonary/Chest: Effort normal and breath sounds normal. No respiratory distress. She has no wheezes.  Psychiatric: She has a normal mood and affect. Her behavior is normal. Judgment and thought content normal.          Assessment & Plan:  Neck mass- will obtain ultrasound to further evaluate.   Gerd- stable with prn use of nexium, continue same.   HTN- uncontrolled- pt is counseled on risks of uncontrolled HTN. She is agreeable to begin amlodipine. She will follow up in 2 weeks for a nurse visit BP check.

## 2016-08-17 NOTE — Patient Instructions (Signed)
Please start amlodipine for your blood pressure. You will be contacted about your neck ultrasound.

## 2016-08-17 NOTE — Progress Notes (Signed)
Pre visit review using our clinic review tool, if applicable. No additional management support is needed unless otherwise documented below in the visit note. 

## 2016-08-31 ENCOUNTER — Ambulatory Visit (HOSPITAL_BASED_OUTPATIENT_CLINIC_OR_DEPARTMENT_OTHER): Payer: 59

## 2016-09-06 ENCOUNTER — Ambulatory Visit (INDEPENDENT_AMBULATORY_CARE_PROVIDER_SITE_OTHER): Payer: 59 | Admitting: Family

## 2016-09-06 ENCOUNTER — Ambulatory Visit (HOSPITAL_BASED_OUTPATIENT_CLINIC_OR_DEPARTMENT_OTHER)
Admission: RE | Admit: 2016-09-06 | Discharge: 2016-09-06 | Disposition: A | Discharge status: Home / Self Care | Payer: 59 | Source: Ambulatory Visit | Attending: Family | Admitting: Family

## 2016-09-06 ENCOUNTER — Telehealth: Payer: Self-pay | Admitting: Family

## 2016-09-06 VITALS — BP 144/74 | HR 73

## 2016-09-06 DIAGNOSIS — I10 Essential (primary) hypertension: Secondary | ICD-10-CM

## 2016-09-06 DIAGNOSIS — R221 Localized swelling, mass and lump, neck: Secondary | ICD-10-CM

## 2016-09-06 NOTE — Telephone Encounter (Signed)
Radiologist is recommending CT of the neck for further evaluation.  Pt will need bmet first please.

## 2016-09-06 NOTE — Progress Notes (Signed)
Pre visit review using our clinic review tool, if applicable. No additional management support is needed unless otherwise documented below in the visit note.  Patient came in office for blood pressure check per OV note 08/17/16. Reviewed medications & regimen with the patient. Today's readings were: BP 153/81 P 81 & BP 144/74 P 73.  Per Inda Castle, NP: Continue current medications & regimen. Follow-up with PCP in 3 months.  Patient was made aware of the provider's instructions. She voiced understanding and had no further concerns before leaving the nurse visit.  Next appointment scheduled for 12/06/16 at 10:45 AM.

## 2016-09-06 NOTE — Patient Instructions (Signed)
Per Inda Castle, NP: Continue current medications & regimen. Follow-up with PCP in 3 months.

## 2016-09-06 NOTE — Progress Notes (Signed)
Noted and agree. 

## 2016-09-06 NOTE — Telephone Encounter (Signed)
Please let pt know that I reviewed her [place] results. [place] was inconclusive and radiologist is recommending

## 2016-09-07 NOTE — Telephone Encounter (Signed)
Notified pt. She is leaving to go out of town and will be back next Monday. Scheduled CT for Wednesday, 09/14/16 at 8:30am at [facility]. Verified with radiology tech that bmet is not needed since pt is only borderline diabetic and not on diabetic medication.

## 2016-09-13 ENCOUNTER — Other Ambulatory Visit: Payer: Self-pay | Admitting: Family

## 2016-09-14 ENCOUNTER — Ambulatory Visit (HOSPITAL_BASED_OUTPATIENT_CLINIC_OR_DEPARTMENT_OTHER)
Admission: RE | Admit: 2016-09-14 | Discharge: 2016-09-14 | Disposition: A | Discharge status: Home / Self Care | Payer: 59 | Source: Ambulatory Visit | Attending: Family | Admitting: Family

## 2016-09-14 ENCOUNTER — Encounter (HOSPITAL_BASED_OUTPATIENT_CLINIC_OR_DEPARTMENT_OTHER): Payer: Self-pay

## 2016-09-14 DIAGNOSIS — J432 Centrilobular emphysema: Secondary | ICD-10-CM | POA: Insufficient documentation

## 2016-09-14 DIAGNOSIS — R590 Localized enlarged lymph nodes: Secondary | ICD-10-CM | POA: Diagnosis not present

## 2016-09-14 DIAGNOSIS — R221 Localized swelling, mass and lump, neck: Secondary | ICD-10-CM | POA: Insufficient documentation

## 2016-09-14 DIAGNOSIS — I708 Atherosclerosis of other arteries: Secondary | ICD-10-CM | POA: Diagnosis not present

## 2016-09-14 MED ORDER — IOPAMIDOL (ISOVUE-300) INJECTION 61%
100.0000 mL | Freq: Once | INTRAVENOUS | Status: AC | PRN
  Administered 2016-09-14: 75 mL via INTRAVENOUS

## 2016-09-16 ENCOUNTER — Telehealth: Payer: Self-pay | Admitting: Family

## 2016-09-16 DIAGNOSIS — I7090 Unspecified atherosclerosis: Secondary | ICD-10-CM

## 2016-09-16 MED ORDER — ASPIRIN EC 81 MG PO TBEC: 81.0000 mg | DELAYED_RELEASE_TABLET | Freq: Every day | ORAL | Status: DC

## 2016-09-16 NOTE — Telephone Encounter (Signed)
Notified pt and she voices understanding and is agreeable to referral. 

## 2016-09-16 NOTE — Telephone Encounter (Signed)
Ct of the neck looks good- does not show any mass.  Note is made of calcifications in her blood vessels which is worse than expected for her age. This is likely secondary to smoking. I would strongly advise her to quit.  There is a subclavian vessel in her neck which is narrowed.  I would like for her to meet with a vascular specialist for further evaluation and recommendations. I would recommend that she add aspirin 81mg  once daily for cardiovascular prevention.

## 2016-09-17 ENCOUNTER — Telehealth: Payer: 59 | Admitting: Nurse Practitioner

## 2016-09-17 DIAGNOSIS — J111 Influenza due to unidentified influenza virus with other respiratory manifestations: Secondary | ICD-10-CM

## 2016-09-17 MED ORDER — OSELTAMIVIR PHOSPHATE 75 MG PO CAPS: 75.0000 mg | ORAL_CAPSULE | Freq: Two times a day (BID) | ORAL | 0 refills | Status: DC

## 2016-09-17 NOTE — Progress Notes (Signed)

## 2016-11-23 ENCOUNTER — Other Ambulatory Visit: Payer: Self-pay

## 2016-11-23 DIAGNOSIS — I871 Compression of vein: Secondary | ICD-10-CM

## 2016-11-25 ENCOUNTER — Ambulatory Visit (HOSPITAL_COMMUNITY): Payer: 59

## 2016-11-25 ENCOUNTER — Encounter: Payer: 59 | Admitting: Vascular Surgery

## 2016-12-06 ENCOUNTER — Ambulatory Visit: Payer: 59 | Admitting: Family

## 2016-12-27 ENCOUNTER — Ambulatory Visit (INDEPENDENT_AMBULATORY_CARE_PROVIDER_SITE_OTHER): Payer: 59 | Admitting: Family

## 2016-12-27 ENCOUNTER — Encounter: Payer: Self-pay | Admitting: Family

## 2016-12-27 VITALS — BP 158/84 | HR 81 | Temp 98.3°F | Resp 18 | Ht 62.0 in | Wt 122.0 lb

## 2016-12-27 DIAGNOSIS — I10 Essential (primary) hypertension: Secondary | ICD-10-CM | POA: Diagnosis not present

## 2016-12-27 DIAGNOSIS — Z720 Tobacco use: Secondary | ICD-10-CM

## 2016-12-27 DIAGNOSIS — I771 Stricture of artery: Secondary | ICD-10-CM | POA: Diagnosis not present

## 2016-12-27 DIAGNOSIS — F17210 Nicotine dependence, cigarettes, uncomplicated: Secondary | ICD-10-CM | POA: Diagnosis not present

## 2016-12-27 MED ORDER — AMLODIPINE BESYLATE 10 MG PO TABS: 10.0000 mg | ORAL_TABLET | Freq: Every day | ORAL | 1 refills | Status: DC

## 2016-12-27 MED ORDER — LOSARTAN POTASSIUM 100 MG PO TABS: 100.0000 mg | ORAL_TABLET | Freq: Every day | ORAL | 1 refills | Status: DC

## 2016-12-27 NOTE — Assessment & Plan Note (Addendum)
61 yr old female with nicotine dependence. Discussed various aids for smoking cessation including zyban, chantix, nicotine replacement.Common side effects of chantix including rare risk of suicide ideation was discussed with the patient today.  Patient is instructed to go directly to the ED if this occurs.  We discussed that patient can continue to smoke for 1 week after starting chantix, but then must discontinue cigarettes.  He is also instructed to contact us prior to completion of the starter month pack for an rx for the continuation month pack.  5 minutes spent with patient today on tobacco cessation counseling.  She will think about it and let me know if she would like rx for chantix.

## 2016-12-27 NOTE — Patient Instructions (Addendum)
Increase amlodipine from 5mg  to 10mg .  Please keep your upcoming appointment with vascular surgeon.

## 2016-12-27 NOTE — Progress Notes (Signed)
Subjective:    Patient ID: Joy Olson, female    DOB: 08/15/1956, 61 y.o.   MRN: [id number]  HPI  Joy Olson is a 61 yr old female who presents today for follow up of her hypertension.  HTN- Current BP medication includes amlodipine 5mg  and losartan 100mg .  140/94 this AM at home.  BP Readings from Last 3 Encounters:  12/27/16 (!) 158/84  09/06/16 (!) 144/74  08/17/16 (!) 170/98    Subclavian stenosis- now on ASA 81mg . Has upcoming consult with Vascular surgeon.   Tobacco abuse- contemplating quitting. Wants to know more about aids to help with smoking.   Review of Systems See HPI  Past Medical History:  Diagnosis Date  . Borderline diabetes 07/26/2016  . Hypertension   . Osteopenia 02/02/2016     Social History   Social History  . Marital status: Married    Spouse name: N/A  . Number of children: 2  . Years of education: N/A   Occupational History  . Not on file.   Social History Main Topics  . Smoking status: Current Every Day Smoker    Packs/day: 0.50    Years: 40.00    Types: Cigarettes  . Smokeless tobacco: Never Used  . Alcohol use No  . Drug use: No  . Sexual activity: Yes   Other Topics Concern  . 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 Worked there for 65 yrs   Married 43 years- husband is Office manager   Enjoys working in the yard (gypsy garden) reading, watching movies   Grew up in [place]       Past Surgical History:  Procedure Laterality Date  . TUBAL LIGATION  1988    Family History  Problem Relation Age of Onset  . Heart disease Mother        hx chf, died at age75  . Heart disease Maternal Grandmother   . Heart attack Maternal Grandfather   . Colon cancer Neg Hx     No Known Allergies  Current Outpatient Prescriptions on File Prior to Visit    Medication Sig Dispense Refill  . amLODipine (NORVASC) 5 MG tablet Take 1 tablet (5 mg total) by mouth daily. 30 tablet 3  . aspirin EC 81 MG tablet Take 1 tablet (81 mg total) by mouth daily.    Marland Kitchen esomeprazole (NEXIUM) 40 MG capsule Take 1 capsule (40 mg total) by mouth daily at 12 noon. (Patient taking differently: Take 40 mg by mouth daily as needed. ) 90 capsule 1  . losartan (COZAAR) 100 MG tablet TAKE 1 TABLET (100 MG TOTAL) BY MOUTH DAILY. 90 tablet 1  . Calcium Carbonate-Vitamin D (CALTRATE 600+D) 600-400 MG-UNIT tablet Take 1 tablet by mouth 2 (two) times daily. (Patient not taking: Reported on 12/27/2016)     Current Facility-Administered Medications on File Prior to Visit  Medication Dose Route Frequency Provider Last Rate Last Dose  . 0.9 %  sodium chloride infusion  500 mL Intravenous Continuous Danis, Estill Cotta III, MD        BP (!) 158/84 (BP Location: Right Arm, Cuff Size: Normal)   Pulse 81   Temp 98.3 F (36.8 C) (Oral)   Resp 18   Ht 5\' 2"  (1.575 m)   Wt 122 lb (  55.3 kg)   LMP 08/15/2009   SpO2 100%   BMI 22.31 kg/m       Objective:   Physical Exam  Constitutional: She is oriented to person, place, and time. She appears well-developed and well-nourished.  HENT:  Head: Normocephalic and atraumatic.  Cardiovascular: Normal rate, regular rhythm and normal heart sounds.   No murmur heard. Pulmonary/Chest: Effort normal and breath sounds normal. No respiratory distress. She has no wheezes.  Neurological: She is alert and oriented to person, place, and time.  Psychiatric: She has a normal mood and affect. Her behavior is normal. Judgment and thought content normal.          Assessment & Plan:  Subclavian stenosis- continue ASA, pt advised to keep upcoming vascular consult.

## 2017-01-03 ENCOUNTER — Telehealth: Payer: Self-pay | Admitting: Family

## 2017-01-03 NOTE — Telephone Encounter (Signed)
Caller name:Joy Olson Relationship to patient: Can be reached:[phone] Pharmacy:  Reason for call:Requesting to speak to nurse regarding quitting smoking

## 2017-01-03 NOTE — Telephone Encounter (Signed)
Spoke with pt. Reports discussing smoking cessation with PCP at last visit and discussed Chantix. Pt nervous about potential side effects of Chantix. States she quit years ago with Zyban and wants to know if she could try that? Thinks she tolerated it well in the past. Please advise?

## 2017-01-04 MED ORDER — BUPROPION HCL ER (SMOKING DET) 150 MG PO TB12
ORAL_TABLET | ORAL | 1 refills | Status: DC
Start: 2017-01-04 — End: 2017-02-20

## 2017-01-04 NOTE — Telephone Encounter (Signed)
rx sent for zyban.  Please follow up in 6 weeks.

## 2017-01-04 NOTE — Telephone Encounter (Signed)
Notified pt and scheduled f/u for 02/20/17 at 2:30pm. Advised pt to keep nurse visit on 01/10/17 for BP check.

## 2017-01-10 ENCOUNTER — Ambulatory Visit: Payer: 59 | Admitting: Family

## 2017-01-10 VITALS — BP 135/74 | HR 81

## 2017-01-10 DIAGNOSIS — I10 Essential (primary) hypertension: Secondary | ICD-10-CM

## 2017-01-10 NOTE — Assessment & Plan Note (Signed)
Uncontrolled, increase amlodipine from 5mg  to 10mg .

## 2017-01-10 NOTE — Progress Notes (Signed)
Noted and agree. 

## 2017-01-10 NOTE — Progress Notes (Signed)
Patient in for BP check per order from M. O'sullivan dated 12/27/2016.  BP today = 135/74 P= 81  Per M. O'sullivan patient to continue taking medications as ordered. Return for OV in 3 months. Patient made aware and has appointment scheduled already.

## 2017-01-20 ENCOUNTER — Encounter: Payer: 59 | Admitting: Vascular Surgery

## 2017-01-20 ENCOUNTER — Encounter (HOSPITAL_COMMUNITY): Payer: 59

## 2017-02-20 ENCOUNTER — Ambulatory Visit (INDEPENDENT_AMBULATORY_CARE_PROVIDER_SITE_OTHER): Payer: 59 | Admitting: Family

## 2017-02-20 ENCOUNTER — Encounter: Payer: Self-pay | Admitting: Family

## 2017-02-20 VITALS — BP 140/76 | HR 75 | Temp 98.4°F | Resp 16 | Ht 62.0 in | Wt 117.2 lb

## 2017-02-20 DIAGNOSIS — Z720 Tobacco use: Secondary | ICD-10-CM | POA: Diagnosis not present

## 2017-02-20 DIAGNOSIS — M79642 Pain in left hand: Secondary | ICD-10-CM

## 2017-02-20 DIAGNOSIS — M79641 Pain in right hand: Secondary | ICD-10-CM

## 2017-02-20 MED ORDER — BUPROPION HCL ER (XL) 300 MG PO TB24: 300.0000 mg | ORAL_TABLET | Freq: Every day | ORAL | 1 refills | Status: DC

## 2017-02-20 MED ORDER — MELOXICAM 7.5 MG PO TABS: 7.5000 mg | ORAL_TABLET | Freq: Every day | ORAL | 0 refills | Status: DC | PRN

## 2017-02-20 NOTE — Progress Notes (Signed)
Subjective:    Patient ID: Joy Olson, female    DOB: Apr 23, 1956, 61 y.o.   MRN: [id number]  HPI  Last visit we discussed smoking cessation. Initially she wanted to try Chantix. However after further thought she was concerned about the potential side effects of Chantix and requested a prescription for Zyban. Prescription was sent in on May 22. She reports that she has not had a cigarette since June 15.  Reports that she "does not think about cigarettes."  Reports she has had some aching/throbbing in her hands. Having some issues with sleep.  She notes that when she takes the afternoon dose of the Zyban she feels mildly anxious and uptight for a little while. She is requesting something to "calm me down" in the evenings. She reports that her mood otherwise is good. She has had side effect of dry mouth and mild headache which is not bothering her much. She did note that over the weekend she had some hand pain. Wasn't sure if it was related to the Zyban or not. She did admit to doing some work with her hands over the weekend shelling and preparing some fresh peas.   Review of Systems See HPI  Past Medical History:  Diagnosis Date  . Borderline diabetes 07/26/2016  . Hypertension   . Osteopenia 02/02/2016     Social History   Social History  . Marital status: Married    Spouse name: N/A  . Number of children: 2  . Years of education: N/A   Occupational History  . Not on file.   Social History Main Topics  . Smoking status: Former Smoker    Packs/day: 0.50    Years: 40.00    Types: Cigarettes    Quit date: 01/27/2017  . Smokeless tobacco: Never Used  . Alcohol use No  . Drug use: No  . Sexual activity: Yes   Other Topics Concern  . 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 Worked there for 60 yrs   Married 29 years-  husband is Office manager   Enjoys working in the yard (gypsy garden) reading, watching movies   Grew up in [place]       Past Surgical History:  Procedure Laterality Date  . TUBAL LIGATION  1988    Family History  Problem Relation Age of Onset  . Heart disease Mother        hx chf, died at age67  . Heart disease Maternal Grandmother   . Heart attack Maternal Grandfather   . Colon cancer Neg Hx     No Known Allergies  Current Outpatient Prescriptions on File Prior to Visit  Medication Sig Dispense Refill  . amLODipine (NORVASC) 10 MG tablet Take 1 tablet (10 mg total) by mouth daily. 90 tablet 1  . aspirin EC 81 MG tablet Take 1 tablet (81 mg total) by mouth daily.    Marland Kitchen buPROPion (ZYBAN) 150 MG 12 hr tablet 1 tab once daily for 3 days, then increase to one tab twice daily 60 tablet 1  . Calcium Carbonate-Vitamin D (CALTRATE 600+D) 600-400 MG-UNIT tablet Take 1 tablet by mouth 2 (two) times daily.    Marland Kitchen esomeprazole (NEXIUM) 40 MG capsule Take 1 capsule (40 mg total) by mouth daily at 12 noon. (Patient taking  differently: Take 40 mg by mouth daily as needed. ) 90 capsule 1  . losartan (COZAAR) 100 MG tablet Take 1 tablet (100 mg total) by mouth daily. 90 tablet 1   Current Facility-Administered Medications on File Prior to Visit  Medication Dose Route Frequency Provider Last Rate Last Dose  . 0.9 %  sodium chloride infusion  500 mL Intravenous Continuous Danis, Estill Cotta III, MD        BP 140/76 (BP Location: Left Arm, Cuff Size: Normal)   Pulse 75   Temp 98.4 F (36.9 C) (Oral)   Resp 16   Ht 5\' 2"  (1.575 m)   Wt 117 lb 3.2 oz (53.2 kg)   LMP 08/15/2009   SpO2 98%   BMI 21.44 kg/m       Objective:   Physical Exam  Constitutional: She is oriented to person, place, and time. She appears well-developed and well-nourished.  HENT:  Head: Normocephalic and atraumatic.  Cardiovascular: Normal rate, regular rhythm and normal heart sounds.   No murmur  heard. Pulmonary/Chest: Effort normal and breath sounds normal. No respiratory distress. She has no wheezes.  Musculoskeletal: She exhibits no edema.  Some bouchard's nodes and Heberden's nodes noted bilateral hands  Neurological: She is alert and oriented to person, place, and time.  Psychiatric: She has a normal mood and affect. Her behavior is normal. Judgment and thought content normal.          Assessment & Plan:  #1 tobacco abuse-she has not had a cigarette nearly a month. I commended her on her hard work. I will try switching her to a once daily extended release form of Wellbutrin to see if this helps with her afternoon/evening irritability. I've advised her what me know if this does not help.  #2. Hand pain -new. I suspect she has some osteoarthritis in her hands. I'm not convinced that it is necessarily secondary to the Wellbutrin. I have given her a prescription for Mobic to use as needed. And advised her to let me know if symptoms worsen. For mild pain she can use Tylenol as needed.

## 2017-02-20 NOTE — Patient Instructions (Addendum)
Great job quitting smoking!!!!!  Please change (zyban) wellbutrin to 300mg  once daily (extended release). You may use meloxicam once daily as needed for joint pain. If pain is mild, you can use tylenol instead.

## 2017-04-04 DIAGNOSIS — H5213 Myopia, bilateral: Secondary | ICD-10-CM | POA: Diagnosis not present

## 2017-04-04 DIAGNOSIS — H52203 Unspecified astigmatism, bilateral: Secondary | ICD-10-CM | POA: Diagnosis not present

## 2017-04-04 DIAGNOSIS — H524 Presbyopia: Secondary | ICD-10-CM | POA: Diagnosis not present

## 2017-04-24 ENCOUNTER — Ambulatory Visit (INDEPENDENT_AMBULATORY_CARE_PROVIDER_SITE_OTHER): Payer: 59 | Admitting: Family

## 2017-04-24 ENCOUNTER — Encounter: Payer: Self-pay | Admitting: Family

## 2017-04-24 DIAGNOSIS — F17210 Nicotine dependence, cigarettes, uncomplicated: Secondary | ICD-10-CM

## 2017-04-24 DIAGNOSIS — I10 Essential (primary) hypertension: Secondary | ICD-10-CM | POA: Diagnosis not present

## 2017-04-24 MED ORDER — METOPROLOL SUCCINATE ER 25 MG PO TB24: 25.0000 mg | ORAL_TABLET | Freq: Every day | ORAL | 3 refills | Status: DC

## 2017-04-24 NOTE — Assessment & Plan Note (Signed)
Doing OK- advised pt to continue wellbutrin and continue to work on complete cessation.

## 2017-04-24 NOTE — Progress Notes (Signed)
Subjective:    Patient ID: Joy Olson, female    DOB: [birth year], 61 y.o.   MRN: [id number]  HPI  Ms. Joy Olson is a 61 yr old female who presents today for follow up.   Continues to have some evening irritability.  Has cheated a few times on smoking. No longer having hand pain/joint swelling. Continues wellbutrin.   HTN- maintained on amlodipine and losartan.   BP Readings from Last 3 Encounters:  04/24/17 (!) 160/92  02/20/17 140/76  01/10/17 135/74     Review of Systems See HPI  Past Medical History:  Diagnosis Date  . Borderline diabetes 07/26/2016  . Hypertension   . Osteopenia 02/02/2016     Social History   Social History  . Marital status: Married    Spouse name: N/A  . Number of children: 2  . Years of education: N/A   Occupational History  . Not on file.   Social History Main Topics  . Smoking status: Former Smoker    Packs/day: 0.50    Years: 40.00    Types: Cigarettes    Quit date: 01/27/2017  . Smokeless tobacco: Never Used  . Alcohol use No  . Drug use: No  . Sexual activity: Yes   Other Topics Concern  . 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 Worked there for 77 yrs   Married 60 years- husband is Office manager   Enjoys working in the yard (gypsy garden) reading, watching movies   Grew up in [place]       Past Surgical History:  Procedure Laterality Date  . TUBAL LIGATION  1988    Family History  Problem Relation Age of Onset  . Heart disease Mother        hx chf, died at age73  . Heart disease Maternal Grandmother   . Heart attack Maternal Grandfather   . Colon cancer Neg Hx     No Known Allergies  Current Outpatient Prescriptions on File Prior to Visit  Medication Sig Dispense Refill  . amLODipine (NORVASC) 10 MG tablet Take 1 tablet (10 mg  total) by mouth daily. 90 tablet 1  . aspirin EC 81 MG tablet Take 1 tablet (81 mg total) by mouth daily.    Marland Kitchen buPROPion (WELLBUTRIN XL) 300 MG 24 hr tablet Take 1 tablet (300 mg total) by mouth daily. 30 tablet 1  . Calcium Carbonate-Vitamin D (CALTRATE 600+D) 600-400 MG-UNIT tablet Take 1 tablet by mouth 2 (two) times daily.    Marland Kitchen esomeprazole (NEXIUM) 40 MG capsule Take 1 capsule (40 mg total) by mouth daily at 12 noon. (Patient taking differently: Take 40 mg by mouth daily as needed. ) 90 capsule 1  . losartan (COZAAR) 100 MG tablet Take 1 tablet (100 mg total) by mouth daily. 90 tablet 1  . meloxicam (MOBIC) 7.5 MG tablet Take 1 tablet (7.5 mg total) by mouth daily as needed for pain. 30 tablet 0   Current Facility-Administered Medications on File Prior to Visit  Medication Dose Route Frequency Provider Last Rate Last Dose  . 0.9 %  sodium chloride infusion  500 mL Intravenous Continuous Danis, Estill Cotta III, MD        BP (!) 160/92 (BP Location: Right Arm, Cuff Size: Normal)   Pulse 72  Temp 98.4 F (36.9 C) (Oral)   Resp 16   Ht 5\' 2"  (1.575 m)   Wt 119 lb 9.6 oz (54.3 kg)   LMP 08/15/2009   SpO2 99%   BMI 21.88 kg/m       Objective:   Physical Exam  Constitutional: She is oriented to person, place, and time. She appears well-developed and well-nourished.  HENT:  Head: Normocephalic and atraumatic.  Cardiovascular: Normal rate, regular rhythm and normal heart sounds.   No murmur heard. Pulmonary/Chest: Effort normal and breath sounds normal. No respiratory distress. She has no wheezes.  Musculoskeletal: She exhibits no edema.  Neurological: She is alert and oriented to person, place, and time.  Psychiatric: She has a normal mood and affect. Her behavior is normal. Judgment and thought content normal.          Assessment & Plan:

## 2017-04-24 NOTE — Patient Instructions (Addendum)
Please add toprol xl once daily.  

## 2017-04-24 NOTE — Assessment & Plan Note (Signed)
Uncontrolled. Add toprol xl 25mg .

## 2017-05-23 ENCOUNTER — Ambulatory Visit (INDEPENDENT_AMBULATORY_CARE_PROVIDER_SITE_OTHER): Payer: 59 | Admitting: Family

## 2017-05-23 ENCOUNTER — Encounter: Payer: Self-pay | Admitting: Family

## 2017-05-23 VITALS — BP 140/70 | HR 62 | Temp 98.0°F | Resp 16 | Ht 62.0 in | Wt 120.0 lb

## 2017-05-23 DIAGNOSIS — I10 Essential (primary) hypertension: Secondary | ICD-10-CM | POA: Diagnosis not present

## 2017-05-23 DIAGNOSIS — Z23 Encounter for immunization: Secondary | ICD-10-CM | POA: Diagnosis not present

## 2017-05-23 DIAGNOSIS — Z720 Tobacco use: Secondary | ICD-10-CM | POA: Diagnosis not present

## 2017-05-23 MED ORDER — BUPROPION HCL ER (XL) 300 MG PO TB24: 300.0000 mg | ORAL_TABLET | Freq: Every day | ORAL | 0 refills | Status: DC

## 2017-05-23 NOTE — Assessment & Plan Note (Signed)
Initial BP elevated. Follow up bp better.  Continue current meds.

## 2017-05-23 NOTE — Addendum Note (Signed)
Addended by: Kelle Darting A on: 05/23/2017 05:27 PM   Modules accepted: Orders

## 2017-05-23 NOTE — Patient Instructions (Signed)
Keep up the good work with quitting smoking.

## 2017-05-23 NOTE — Progress Notes (Signed)
Subjective:    Patient ID: Joy Olson, female    DOB: 1955-12-11, 61 y.o.   MRN: [id number]  HPI  Joy Olson is a 61 yr old female who presents today for follow up of her hypertension.  Last visit BP was noted to be elevated and we added toprol xl 25mg  to her regimen.  Tolerating metoprolol,  Also on amlodipine.    BP Readings from Last 3 Encounters:  05/23/17 (!) 148/69  04/24/17 (!) 158/75  02/20/17 140/76   Tobacco abuse-  Still trying to quit.  Smoking 1 cigarette  occasionally. Continues zyban.   Review of Systems    see HPI  Past Medical History:  Diagnosis Date  . Borderline diabetes 07/26/2016  . Hypertension   . Osteopenia 02/02/2016     Social History   Social History  . Marital status: Married    Spouse name: N/A  . Number of children: 2  . Years of education: N/A   Occupational History  . Not on file.   Social History Main Topics  . Smoking status: Former Smoker    Packs/day: 0.50    Years: 40.00    Types: Cigarettes    Quit date: 01/27/2017  . Smokeless tobacco: Never Used  . Alcohol use No  . Drug use: No  . Sexual activity: Yes   Other Topics Concern  . 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 Worked there for 52 yrs   Married 71 years- husband is Office manager   Enjoys working in the yard (gypsy garden) reading, watching movies   Grew up in [place]       Past Surgical History:  Procedure Laterality Date  . TUBAL LIGATION  1988    Family History  Problem Relation Age of Onset  . Heart disease Mother        hx chf, died at age70  . Heart disease Maternal Grandmother   . Heart attack Maternal Grandfather   . Colon cancer Neg Hx     No Known Allergies  Current Outpatient Prescriptions on File Prior to Visit  Medication Sig Dispense Refill  . amLODipine  (NORVASC) 10 MG tablet Take 1 tablet (10 mg total) by mouth daily. 90 tablet 1  . aspirin EC 81 MG tablet Take 1 tablet (81 mg total) by mouth daily.    Marland Kitchen buPROPion (WELLBUTRIN XL) 300 MG 24 hr tablet Take 1 tablet (300 mg total) by mouth daily. 30 tablet 1  . Calcium Carbonate-Vitamin D (CALTRATE 600+D) 600-400 MG-UNIT tablet Take 1 tablet by mouth 2 (two) times daily.    Marland Kitchen esomeprazole (NEXIUM) 40 MG capsule Take 1 capsule (40 mg total) by mouth daily at 12 noon. (Patient taking differently: Take 40 mg by mouth daily as needed. ) 90 capsule 1  . losartan (COZAAR) 100 MG tablet Take 1 tablet (100 mg total) by mouth daily. 90 tablet 1  . meloxicam (MOBIC) 7.5 MG tablet Take 1 tablet (7.5 mg total) by mouth daily as needed for pain. 30 tablet 0  . metoprolol succinate (TOPROL-XL) 25 MG 24 hr tablet Take 1 tablet (25 mg total) by mouth daily. 30 tablet 3   Current Facility-Administered Medications on File Prior to Visit  Medication Dose Route Frequency Provider Last Rate Last Dose  .  0.9 %  sodium chloride infusion  500 mL Intravenous Continuous Danis, Estill Cotta III, MD        BP (!) 148/69 (BP Location: Right Arm, Cuff Size: Normal)   Pulse 62   Temp 98 F (36.7 C) (Oral)   Resp 16   Ht 5\' 2"  (1.575 m)   Wt 120 lb (54.4 kg)   LMP 08/15/2009   SpO2 99%   BMI 21.95 kg/m    Objective:   Physical Exam  Constitutional: She is oriented to person, place, and time. She appears well-developed and well-nourished.  HENT:  Head: Normocephalic and atraumatic.  Cardiovascular:  No murmur heard. Musculoskeletal: She exhibits no edema.  Neurological: She is alert and oriented to person, place, and time.  Psychiatric: She has a normal mood and affect. Her behavior is normal. Judgment and thought content normal.          Assessment & Plan:  Tobacco abuse- improved.  Still cheats occasionally. Advised pt to continue zyban for now.    Flu shot today

## 2017-08-29 ENCOUNTER — Ambulatory Visit: Payer: 59 | Admitting: Family

## 2017-08-30 ENCOUNTER — Ambulatory Visit: Payer: 59 | Admitting: Family

## 2017-09-13 ENCOUNTER — Other Ambulatory Visit: Payer: Self-pay | Admitting: Family

## 2017-10-25 ENCOUNTER — Ambulatory Visit (INDEPENDENT_AMBULATORY_CARE_PROVIDER_SITE_OTHER): Payer: 59 | Admitting: Family

## 2017-10-25 ENCOUNTER — Encounter: Payer: Self-pay | Admitting: Family

## 2017-10-25 VITALS — BP 133/85 | HR 80 | Temp 99.0°F | Resp 16 | Ht 62.0 in | Wt 120.0 lb

## 2017-10-25 DIAGNOSIS — Z720 Tobacco use: Secondary | ICD-10-CM | POA: Diagnosis not present

## 2017-10-25 DIAGNOSIS — I10 Essential (primary) hypertension: Secondary | ICD-10-CM | POA: Diagnosis not present

## 2017-10-25 LAB — BASIC METABOLIC PANEL
BUN: 10 mg/dL (ref 6–23)
CALCIUM: 10 mg/dL (ref 8.4–10.5)
CO2: 30 meq/L (ref 19–32)
CREATININE: 0.73 mg/dL (ref 0.40–1.20)
Chloride: 102 mEq/L (ref 96–112)
GFR: 86.55 mL/min (ref >60.00)
GLUCOSE: 108 mg/dL — AB (ref 70–99)
Potassium: 4.6 mEq/L (ref 3.5–5.1)
Sodium: 138 mEq/L (ref 135–145)

## 2017-10-25 MED ORDER — LOSARTAN POTASSIUM 100 MG PO TABS
100.0000 mg | ORAL_TABLET | Freq: Every day | ORAL | 1 refills | Status: DC
Start: 2017-10-25 — End: 2018-10-09

## 2017-10-25 MED ORDER — AMLODIPINE BESYLATE 10 MG PO TABS: 10.0000 mg | ORAL_TABLET | Freq: Every day | ORAL | 1 refills | Status: DC

## 2017-10-25 NOTE — Patient Instructions (Signed)
Please complete lab work prior to leaving.   

## 2017-10-26 ENCOUNTER — Other Ambulatory Visit: Payer: Self-pay | Admitting: Family

## 2017-10-26 NOTE — Progress Notes (Signed)
Subjective:    Patient ID: Joy Olson, female    DOB: 31-Oct-1955, 62 y.o.   MRN: [id number]  HPI  Patient is a 62 yr old female who presents today for follow up.  HTN- she is maintained on amlodipin, wellbutrin, metoprolol.  BP Readings from Last 3 Encounters:  10/25/17 133/85  05/23/17 140/70  04/24/17 (!) 158/75   Tobacco abuse- reports that she quit x 2 months but then she started again due to stress. She and her husband plan to try to quit again together.    Review of Systems See HPI  Past Medical History:  Diagnosis Date  . Borderline diabetes 07/26/2016  . Hypertension   . Osteopenia 02/02/2016     Social History   Socioeconomic History  . Marital status: Married    Spouse name: Not on file  . Number of children: 2  . Years of education: Not on file  . Highest education level: Not on file  Social Needs  . Financial resource strain: Not on file  . Food insecurity - worry: Not on file  . Food insecurity - inability: Not on file  . Transportation needs - medical: Not on file  . Transportation needs - non-medical: Not on file  Occupational History  . Not on file  Tobacco Use  . Smoking status: Former Smoker    Packs/day: 0.50    Years: 40.00    Pack years: 20.00    Types: Cigarettes    Last attempt to quit: 01/27/2017    Years since quitting: 0.7  . Smokeless tobacco: Never Used  Substance and Sexual Activity  . Alcohol use: No  . Drug use: No  . Sexual activity: Yes  Other Topics Concern  . 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 Worked there for 59 yrs   Married 47 years- husband is Office manager   Enjoys working in the yard (gypsy garden) reading, watching movies   Grew up in [place]    Past Surgical History:  Procedure Laterality Date  . TUBAL LIGATION  1988     Family History  Problem Relation Age of Onset  . Heart disease Mother        hx chf, died at age62  . Heart disease Maternal Grandmother   . Heart attack Maternal Grandfather   . Colon cancer Neg Hx     No Known Allergies  Current Outpatient Medications on File Prior to Visit  Medication Sig Dispense Refill  . buPROPion (WELLBUTRIN XL) 300 MG 24 hr tablet Take 1 tablet (300 mg total) by mouth daily. 90 tablet 0  . esomeprazole (NEXIUM) 40 MG capsule Take 1 capsule (40 mg total) by mouth daily at 12 noon. (Patient taking differently: Take 40 mg by mouth daily as needed. ) 90 capsule 1  . metoprolol succinate (TOPROL-XL) 25 MG 24 hr tablet Take 1 tablet (25 mg total) by mouth daily. 30 tablet 3  . meloxicam (MOBIC) 7.5 MG tablet Take 1 tablet (7.5 mg total) by mouth daily as needed for pain. (Patient not taking: Reported on 10/25/2017) 30 tablet 0   Current Facility-Administered Medications on File Prior to Visit  Medication Dose Route Frequency Provider Last Rate Last Dose  . 0.9 %  sodium chloride infusion  500 mL Intravenous Continuous Danis,  Estill Cotta III, MD        BP 133/85 (BP Location: Right Arm, Patient Position: Sitting, Cuff Size: Small)   Pulse 80   Temp 99 F (37.2 C) (Oral)   Resp 16   Ht 5\' 2"  (1.575 m)   Wt 120 lb (54.4 kg)   LMP 08/15/2009   SpO2 100%   BMI 21.95 kg/m       Objective:   Physical Exam  Constitutional: She is oriented to person, place, and time. She appears well-developed and well-nourished.  HENT:  Head: Normocephalic and atraumatic.  Cardiovascular: Normal rate, regular rhythm and normal heart sounds.  No murmur heard. Pulmonary/Chest: Effort normal and breath sounds normal. No respiratory distress. She has no wheezes.  Musculoskeletal: She exhibits no edema.  Neurological: She is alert and oriented to person, place, and time.  Skin: Skin is warm and dry.  Psychiatric: She has a normal mood and affect. Her behavior is normal. Judgment  and thought content normal.          Assessment & Plan:  HTN- stable on current regimen, continue same, obtain follow up bmet.  Tobacco abuse- encouraged her to again attempt complete cessation. She has wellbutrin rx at home when she is ready.

## 2018-01-24 ENCOUNTER — Encounter: Payer: Self-pay | Admitting: Family

## 2018-01-24 ENCOUNTER — Ambulatory Visit (HOSPITAL_BASED_OUTPATIENT_CLINIC_OR_DEPARTMENT_OTHER)
Admission: RE | Admit: 2018-01-24 | Discharge: 2018-01-24 | Disposition: A | Discharge status: Home / Self Care | Payer: 59 | Source: Ambulatory Visit | Attending: Family | Admitting: Family

## 2018-01-24 ENCOUNTER — Other Ambulatory Visit (HOSPITAL_COMMUNITY)
Admission: RE | Admit: 2018-01-24 | Discharge: 2018-01-24 | Disposition: A | Discharge status: Home / Self Care | Payer: 59 | Source: Ambulatory Visit | Attending: Family | Admitting: Family

## 2018-01-24 ENCOUNTER — Ambulatory Visit (INDEPENDENT_AMBULATORY_CARE_PROVIDER_SITE_OTHER): Payer: 59 | Admitting: Family

## 2018-01-24 VITALS — BP 142/78 | HR 71 | Temp 98.3°F | Resp 18 | Ht 61.5 in | Wt 119.2 lb

## 2018-01-24 DIAGNOSIS — Z79899 Other long term (current) drug therapy: Secondary | ICD-10-CM | POA: Diagnosis not present

## 2018-01-24 DIAGNOSIS — I10 Essential (primary) hypertension: Secondary | ICD-10-CM | POA: Diagnosis not present

## 2018-01-24 DIAGNOSIS — Z0000 Encounter for general adult medical examination without abnormal findings: Secondary | ICD-10-CM | POA: Diagnosis not present

## 2018-01-24 DIAGNOSIS — Z1231 Encounter for screening mammogram for malignant neoplasm of breast: Secondary | ICD-10-CM | POA: Diagnosis not present

## 2018-01-24 DIAGNOSIS — Z01419 Encounter for gynecological examination (general) (routine) without abnormal findings: Secondary | ICD-10-CM | POA: Diagnosis not present

## 2018-01-24 DIAGNOSIS — F17210 Nicotine dependence, cigarettes, uncomplicated: Secondary | ICD-10-CM | POA: Diagnosis present

## 2018-01-24 LAB — CBC WITH DIFFERENTIAL/PLATELET
BASOS PCT: 0.8 % (ref 0.0–3.0)
Basophils Absolute: 0.1 10*3/uL (ref 0.0–0.1)
EOS PCT: 1.2 % (ref 0.0–5.0)
Eosinophils Absolute: 0.1 10*3/uL (ref 0.0–0.7)
HCT: 42.2 % (ref 36.0–46.0)
HEMOGLOBIN: 14.1 g/dL (ref 12.0–15.0)
LYMPHS ABS: 2.2 10*3/uL (ref 0.7–4.0)
Lymphocytes Relative: 24 % (ref 12.0–46.0)
MCHC: 33.4 g/dL (ref 30.0–36.0)
MCV: 89.5 fl (ref 78.0–100.0)
MONOS PCT: 7.8 % (ref 3.0–12.0)
Monocytes Absolute: 0.7 10*3/uL (ref 0.1–1.0)
Neutro Abs: 6.2 10*3/uL (ref 1.4–7.7)
Neutrophils Relative %: 66.2 % (ref 43.0–77.0)
Platelets: 355 10*3/uL (ref 150.0–400.0)
RBC: 4.72 Mil/uL (ref 3.87–5.11)
RDW: 13.6 % (ref 11.5–15.5)
WBC: 9.3 10*3/uL (ref 4.0–10.5)

## 2018-01-24 LAB — URINALYSIS, ROUTINE W REFLEX MICROSCOPIC
BILIRUBIN URINE: NEGATIVE
HGB URINE DIPSTICK: NEGATIVE
KETONES UR: NEGATIVE
LEUKOCYTES UA: NEGATIVE
Nitrite: NEGATIVE
RBC / HPF: NONE SEEN (ref 0–?)
Specific Gravity, Urine: <=1.005 — AB (ref 1.000–1.030)
TOTAL PROTEIN, URINE-UPE24: NEGATIVE
UROBILINOGEN UA: 0.2 (ref 0.0–1.0)
Urine Glucose: NEGATIVE
pH: 6.5 (ref 5.0–8.0)

## 2018-01-24 LAB — LIPID PANEL
CHOLESTEROL: 222 mg/dL — AB (ref 0–200)
HDL: 53.6 mg/dL (ref >39.00)
LDL CALC: 140 mg/dL — AB (ref 0–99)
NonHDL: 168.75
TRIGLYCERIDES: 145 mg/dL (ref 0.0–149.0)
Total CHOL/HDL Ratio: 4
VLDL: 29 mg/dL (ref 0.0–40.0)

## 2018-01-24 LAB — BASIC METABOLIC PANEL
BUN: 9 mg/dL (ref 6–23)
CHLORIDE: 102 meq/L (ref 96–112)
CO2: 29 mEq/L (ref 19–32)
Calcium: 9.9 mg/dL (ref 8.4–10.5)
Creatinine, Ser: 0.7 mg/dL (ref 0.40–1.20)
GFR: 90.77 mL/min (ref >60.00)
GLUCOSE: 94 mg/dL (ref 70–99)
POTASSIUM: 4.5 meq/L (ref 3.5–5.1)
SODIUM: 138 meq/L (ref 135–145)

## 2018-01-24 LAB — HEPATIC FUNCTION PANEL
ALBUMIN: 4.6 g/dL (ref 3.5–5.2)
ALT: 9 U/L (ref 0–35)
AST: 13 U/L (ref 0–37)
Alkaline Phosphatase: 80 U/L (ref 39–117)
Bilirubin, Direct: 0.1 mg/dL (ref 0.0–0.3)
Total Bilirubin: 0.5 mg/dL (ref 0.2–1.2)
Total Protein: 6.9 g/dL (ref 6.0–8.3)

## 2018-01-24 LAB — TSH: TSH: 1.52 u[IU]/mL (ref 0.35–4.50)

## 2018-01-24 NOTE — Patient Instructions (Addendum)
Please schedule mammogram on the first floor. Complete lab work prior to leaving.  Avoid tanning beds and wear sunscreen when you are in the sun. It is important for your health that you quit smoking. Continue healthy diet and regular exercise.

## 2018-01-24 NOTE — Progress Notes (Signed)
Subjective:    Patient ID: Joy Olson, female    DOB: 1956-02-17, 62 y.o.   MRN: [id number]  HPI  Pt is a 62 yr old female who presents today for cpx.    Patient presents today for complete physical.  Immunizations:tdap 07/25/16, shingrix on national back order but due.  Diet:  Reports diet is healthy Exercise:  Walks 1 mile every night after dinner Wt Readings from Last 3 Encounters:  01/24/18 119 lb 3.2 oz (54.1 kg)  10/25/17 120 lb (54.4 kg)  05/23/17 120 lb (54.4 kg)  Colonoscopy: 03/25/16- will repeat in 5 years Dexa: 02/02/16 Pap Smear: 07/02/15 Mammogram: 02/02/16 Vision: up to date Dental:  Up to date    Review of Systems  Constitutional: Negative for unexpected weight change.  HENT: Negative for hearing loss and rhinorrhea.   Eyes: Negative for visual disturbance.  Respiratory: Negative for cough and shortness of breath.   Cardiovascular: Negative for chest pain and leg swelling.  Gastrointestinal: Negative for blood in stool, constipation and diarrhea.  Genitourinary: Negative for dysuria, frequency and hematuria.  Musculoskeletal: Negative for arthralgias and myalgias.  Skin: Negative for rash.  Neurological: Negative for headaches.  Hematological: Negative for adenopathy.  Psychiatric/Behavioral:       Denies depression/anxiety   Past Medical History:  Diagnosis Date  . Borderline diabetes 07/26/2016  . Hypertension   . Osteopenia 02/02/2016     Social History   Socioeconomic History  . Marital status: Married    Spouse name: Not on file  . Number of children: 2  . Years of education: Not on file  . Highest education level: Not on file  Occupational History  . Not on file  Social Needs  . Financial resource strain: Not on file  . Food insecurity:    Worry: Not on file    Inability: Not on file  . Transportation needs:    Medical: Not on file    Non-medical: Not on file  Tobacco Use  . Smoking status: Current Every Day Smoker   Packs/day: 0.50    Years: 40.00    Pack years: 20.00    Types: Cigarettes    Last attempt to quit: 01/27/2017    Years since quitting: 0.9  . Smokeless tobacco: Never Used  Substance and Sexual Activity  . Alcohol use: No  . Drug use: No  . Sexual activity: Yes  Lifestyle  . Physical activity:    Days per week: Not on file    Minutes per session: Not on file  . Stress: Not on file  Relationships  . Social connections:    Talks on phone: Not on file    Gets together: Not on file    Attends religious service: Not on file    Active member of club or organization: Not on file    Attends meetings of clubs or organizations: Not on file    Relationship status: Not on file  . Intimate partner violence:    Fear of current or ex partner: Not on file    Emotionally abused: Not on file    Physically abused: Not on file    Forced sexual activity: Not on file  Other Topics Concern  . 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 Worked there for 27 yrs  Married 62 years- husband is Office manager   Enjoys working in the yard (gypsy garden) reading, watching movies   [place] up in [place]    Past Surgical History:  Procedure Laterality Date  . TUBAL LIGATION  1988    Family History  Problem Relation Age of Onset  . Heart disease Mother        hx chf, died at age51  . Heart disease Maternal Grandmother   . Heart attack Maternal Grandfather   . Colon cancer Neg Hx     No Known Allergies  Current Outpatient Medications on File Prior to Visit  Medication Sig Dispense Refill  . amLODipine (NORVASC) 10 MG tablet Take 1 tablet (10 mg total) by mouth daily. 90 tablet 1  . esomeprazole (NEXIUM) 40 MG capsule Take 1 capsule (40 mg total) by mouth daily at 12 noon. (Patient taking differently: Take 40 mg by mouth daily as  needed. ) 90 capsule 1  . losartan (COZAAR) 100 MG tablet Take 1 tablet (100 mg total) by mouth daily. 90 tablet 1  . metoprolol succinate (TOPROL-XL) 25 MG 24 hr tablet TAKE 1 TABLET (25 MG TOTAL) BY MOUTH DAILY. 90 tablet 1   Current Facility-Administered Medications on File Prior to Visit  Medication Dose Route Frequency Provider Last Rate Last Dose  . 0.9 %  sodium chloride infusion  500 mL Intravenous Continuous Danis, Estill Cotta III, MD        BP (!) 142/78 (BP Location: Right Arm, Cuff Size: Normal)   Pulse 71   Temp 98.3 F (36.8 C) (Oral)   Resp 18   Ht 5' 1.5" (1.562 m)   Wt 119 lb 3.2 oz (54.1 kg)   LMP 08/15/2009   SpO2 100%   BMI 22.16 kg/m        Objective:   Physical Exam Physical Exam  Constitutional: She is oriented to person, place, and time. She appears well-developed and well-nourished. No distress.  HENT:  Head: Normocephalic and atraumatic.  Right Ear: Tympanic membrane and ear canal normal.  Left Ear: Tympanic membrane and ear canal normal.  Mouth/Throat: Oropharynx is clear and moist.  Eyes: Pupils are equal, round, and reactive to light. No scleral icterus.  Neck: Normal range of motion. No thyromegaly present.  Cardiovascular: Normal rate and regular rhythm.   No murmur heard. Pulmonary/Chest: Effort normal and breath sounds normal. No respiratory distress. He has no wheezes. She has no rales. She exhibits no tenderness.  Abdominal: Soft. Bowel sounds are normal. She exhibits no distension and no mass. There is no tenderness. There is no rebound and no guarding.  Musculoskeletal: She exhibits no edema.  Lymphadenopathy:    She has no cervical adenopathy.  Neurological: She is alert and oriented to person, place, and time. She has normal patellar reflexes. She exhibits normal muscle tone. Coordination normal.  Skin: Skin is warm and dry. sun tanned skin noted.  Psychiatric: She has a normal mood and affect. Her behavior is normal. Judgment and thought  content normal.  Breasts: Examined lying Right: Without masses, retractions, discharge or axillary adenopathy.  Left: Without masses, retractions, discharge or axillary adenopathy.  Inguinal/mons: Normal without inguinal adenopathy  External genitalia: Normal  BUS/Urethra/Skene's glands: Normal  Bladder: Normal  Vagina: Normal  Cervix: Normal  Uterus: normal in size, shape and contour. Midline and mobile  Adnexa/parametria:  Rt: Without masses or tenderness.  Lt: Without masses or tenderness.  Anus and perineum: Normal  Assessment & Plan:   Preventative care- pap done today.  Refer for mammogram.  Obtain routine lab work.  Advised pt as follows:   Avoid tanning beds and wear sunscreen when you are in the sun. It is important for your health that you quit smoking. Continue healthy diet and regular exercise.        Assessment & Plan:  EKG tracing is personally reviewed.  EKG notes NSR.  No acute changes.

## 2018-01-24 NOTE — Addendum Note (Signed)
Addended by: Kelle Darting A on: 01/24/2018 10:49 AM   Modules accepted: Orders

## 2018-01-25 LAB — CYTOLOGY - PAP
ADEQUACY: ABSENT
DIAGNOSIS: NEGATIVE
HPV (WINDOPATH): NOT DETECTED

## 2018-02-01 ENCOUNTER — Other Ambulatory Visit: Payer: Self-pay | Admitting: Family

## 2018-04-05 DIAGNOSIS — H52203 Unspecified astigmatism, bilateral: Secondary | ICD-10-CM | POA: Diagnosis not present

## 2018-04-05 DIAGNOSIS — H5213 Myopia, bilateral: Secondary | ICD-10-CM | POA: Diagnosis not present

## 2018-09-26 ENCOUNTER — Other Ambulatory Visit: Payer: Self-pay | Admitting: Family

## 2018-09-27 NOTE — Telephone Encounter (Signed)
Medications last refilled 10/25/17 last ov 01/31/18 has not scheduled 6 months f/up yet please advise. MyChart message sent to patient to call for appointment.

## 2018-10-09 ENCOUNTER — Encounter: Payer: Self-pay | Admitting: Family

## 2018-10-09 ENCOUNTER — Ambulatory Visit (INDEPENDENT_AMBULATORY_CARE_PROVIDER_SITE_OTHER): Payer: BLUE CROSS/BLUE SHIELD | Admitting: Family

## 2018-10-09 VITALS — BP 151/70 | HR 66 | Temp 98.5°F | Resp 16 | Ht 61.5 in | Wt 126.0 lb

## 2018-10-09 DIAGNOSIS — Z720 Tobacco use: Secondary | ICD-10-CM

## 2018-10-09 DIAGNOSIS — K219 Gastro-esophageal reflux disease without esophagitis: Secondary | ICD-10-CM

## 2018-10-09 DIAGNOSIS — I10 Essential (primary) hypertension: Secondary | ICD-10-CM

## 2018-10-09 MED ORDER — ESOMEPRAZOLE MAGNESIUM 40 MG PO CPDR: 40.0000 mg | DELAYED_RELEASE_CAPSULE | Freq: Every day | ORAL | 1 refills | Status: DC | PRN

## 2018-10-09 MED ORDER — LOSARTAN POTASSIUM 100 MG PO TABS
100.0000 mg | ORAL_TABLET | Freq: Every day | ORAL | 1 refills | Status: DC
Start: 2018-10-09 — End: 2019-01-11

## 2018-10-09 MED ORDER — METOPROLOL SUCCINATE ER 25 MG PO TB24: 25.0000 mg | ORAL_TABLET | Freq: Every day | ORAL | 1 refills | Status: DC

## 2018-10-09 MED ORDER — AMLODIPINE BESYLATE 10 MG PO TABS: 10.0000 mg | ORAL_TABLET | Freq: Every day | ORAL | 1 refills | Status: DC

## 2018-10-09 NOTE — Progress Notes (Signed)
Subjective:    Patient ID: Joy Olson, female    DOB: [birth year], 63 y.o.   MRN: [id number]  HPI  Patient is a 63 yr old female who presents today for follow up.  HTN- Reports that that she has taken all her meds.   BP Readings from Last 3 Encounters:  10/09/18 (!) 151/70  01/24/18 (!) 142/78  10/25/17 133/85   GERD- reports that she takes intermittently which helps her.  Did not feel that pepcid was effective in the past.   Tobacco abuse- she is hoping to quit smoking this year.  Not ready yet but plans to try chantix.    Review of Systems See HPI  Past Medical History:  Diagnosis Date  . Borderline diabetes 07/26/2016  . Hypertension   . Osteopenia 02/02/2016     Social History   Socioeconomic History  . Marital status: Married    Spouse name: Not on file  . Number of children: 2  . Years of education: Not on file  . Highest education level: Not on file  Occupational History  . Not on file  Social Needs  . Financial resource strain: Not on file  . Food insecurity:    Worry: Not on file    Inability: Not on file  . Transportation needs:    Medical: Not on file    Non-medical: Not on file  Tobacco Use  . Smoking status: Current Every Day Smoker    Packs/day: 0.50    Years: 40.00    Pack years: 20.00    Types: Cigarettes    Last attempt to quit: 01/27/2017    Years since quitting: 1.6  . Smokeless tobacco: Never Used  Substance and Sexual Activity  . Alcohol use: No  . Drug use: No  . Sexual activity: Yes  Lifestyle  . Physical activity:    Days per week: Not on file    Minutes per session: Not on file  . Stress: Not on file  Relationships  . Social connections:    Talks on phone: Not on file    Gets together: Not on file    Attends religious service: Not on file    Active member of club or organization: Not on file    Attends meetings of clubs or organizations: Not on file    Relationship status: Not on file  . Intimate partner violence:      Fear of current or ex partner: Not on file    Emotionally abused: Not on file    Physically abused: Not on file    Forced sexual activity: Not on file  Other Topics Concern  . 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 Worked there for 22 yrs   Married 43 years- husband is Office manager   Enjoys working in the yard (gypsy garden) reading, watching movies   Grew up in [place]    Past Surgical History:  Procedure Laterality Date  . TUBAL LIGATION  1988    Family History  Problem Relation Age of Onset  . Heart disease Mother        hx chf, died at age42  . Heart disease Maternal Grandmother   . Heart attack Maternal Grandfather   . Colon cancer Neg Hx     No Known  Allergies  Current Outpatient Medications on File Prior to Visit  Medication Sig Dispense Refill  . amLODipine (NORVASC) 10 MG tablet TAKE 1 TABLET (10 MG TOTAL) BY MOUTH DAILY. 30 tablet 0  . esomeprazole (NEXIUM) 40 MG capsule Take 1 capsule (40 mg total) by mouth daily as needed. 90 capsule 1  . losartan (COZAAR) 100 MG tablet Take 1 tablet (100 mg total) by mouth daily. 90 tablet 1  . metoprolol succinate (TOPROL-XL) 25 MG 24 hr tablet TAKE 1 TABLET BY MOUTH DAILY 30 tablet 0   Current Facility-Administered Medications on File Prior to Visit  Medication Dose Route Frequency Provider Last Rate Last Dose  . 0.9 %  sodium chloride infusion  500 mL Intravenous Continuous Danis, Estill Cotta III, MD        BP (!) 151/70 (BP Location: Right Arm, Patient Position: Sitting, Cuff Size: Small)   Pulse 66   Temp 98.5 F (36.9 C) (Oral)   Resp 16   Ht 5' 1.5" (1.562 m)   Wt 126 lb (57.2 kg)   LMP 08/15/2009   SpO2 100%   BMI 23.42 kg/m       Objective:   Physical Exam Constitutional:      Appearance: She is well-developed.  Neck:      Musculoskeletal: Neck supple.     Thyroid: No thyromegaly.  Cardiovascular:     Rate and Rhythm: Normal rate and regular rhythm.     Heart sounds: Normal heart sounds. No murmur.  Pulmonary:     Effort: Pulmonary effort is normal. No respiratory distress.     Breath sounds: Normal breath sounds. No wheezing.  Skin:    General: Skin is warm and dry.  Neurological:     Mental Status: She is alert and oriented to person, place, and time.  Psychiatric:        Behavior: Behavior normal.        Thought Content: Thought content normal.        Judgment: Judgment normal.           Assessment & Plan:  HTN- bp is mildly elevated. I advised pt to monitor at home and send me her readings. Plan to continue current meds/doses and repeat in 3 months.  GERD- stable on ppi. Continue same. We discussed long term side effects of bone loss and possible dementia and she verbalizes understanding.  Tobacco abuse- advised pt to call me when she is ready to quit and I will send chantix starter pack to her pharmacy.

## 2018-10-09 NOTE — Patient Instructions (Signed)
Please complete lab work prior to leaving.   

## 2018-10-10 LAB — BASIC METABOLIC PANEL
BUN: 13 mg/dL (ref 6–23)
CO2: 29 mEq/L (ref 19–32)
Calcium: 9.5 mg/dL (ref 8.4–10.5)
Chloride: 103 mEq/L (ref 96–112)
Creatinine, Ser: 0.94 mg/dL (ref 0.40–1.20)
GFR: 60.63 mL/min (ref >60.00)
Glucose, Bld: 72 mg/dL (ref 70–99)
Potassium: 3.9 mEq/L (ref 3.5–5.1)
Sodium: 140 mEq/L (ref 135–145)

## 2019-01-11 ENCOUNTER — Ambulatory Visit (INDEPENDENT_AMBULATORY_CARE_PROVIDER_SITE_OTHER): Payer: BLUE CROSS/BLUE SHIELD | Admitting: Family

## 2019-01-11 ENCOUNTER — Other Ambulatory Visit: Payer: Self-pay

## 2019-01-11 ENCOUNTER — Encounter: Payer: Self-pay | Admitting: Family

## 2019-01-11 VITALS — Ht 61.5 in | Wt 120.0 lb

## 2019-01-11 DIAGNOSIS — Z720 Tobacco use: Secondary | ICD-10-CM | POA: Diagnosis not present

## 2019-01-11 DIAGNOSIS — K219 Gastro-esophageal reflux disease without esophagitis: Secondary | ICD-10-CM

## 2019-01-11 DIAGNOSIS — I10 Essential (primary) hypertension: Secondary | ICD-10-CM

## 2019-01-11 MED ORDER — ESOMEPRAZOLE MAGNESIUM 40 MG PO CPDR: 40.0000 mg | DELAYED_RELEASE_CAPSULE | Freq: Every day | ORAL | 1 refills | Status: DC | PRN

## 2019-01-11 MED ORDER — LOSARTAN POTASSIUM 100 MG PO TABS: 100.0000 mg | ORAL_TABLET | Freq: Every day | ORAL | 1 refills | Status: DC

## 2019-01-11 MED ORDER — AMLODIPINE BESYLATE 10 MG PO TABS: 10.0000 mg | ORAL_TABLET | Freq: Every day | ORAL | 1 refills | Status: DC

## 2019-01-11 MED ORDER — METOPROLOL SUCCINATE ER 50 MG PO TB24: 50.0000 mg | ORAL_TABLET | Freq: Every day | ORAL | 1 refills | Status: DC

## 2019-01-11 NOTE — Progress Notes (Signed)
Virtual Visit via Video Note  I connected with  on 01/11/19 at  8:20 AM EDT by a video enabled telemedicine application and verified that I am speaking with the correct person using two identifiers. This visit type was conducted due to national recommendations for restrictions regarding the COVID-19 Pandemic (e.g. social distancing).  This format is felt to be most appropriate for this patient at this time.   I discussed the limitations of evaluation and management by telemedicine and the availability of in person appointments. The patient expressed understanding and agreed to proceed.  Only the patient and myself were on today's video visit. The patient was at home and I was in my office at the time of today's visit.   History of Present Illness:  Patient is a 63 yr old female who presents today for routine follow up.  HTN- last visit bp was elevated at 151/70.   Pt reports home readings 4/2 141/81 155/71 (wed) 157/90    BP Readings from Last 3 Encounters:  10/09/18 (!) 151/70  01/24/18 (!) 142/78  10/25/17 133/85   GERD-  Continues nexium prn. Reports that symptoms are well controlled.   Tobacco abuse- not ready to quit.     Past Medical History:  Diagnosis Date  . Borderline diabetes 07/26/2016  . Hypertension   . Osteopenia 02/02/2016     Social History   Socioeconomic History  . Marital status: Married    Spouse name: Not on file  . Number of children: 2  . Years of education: Not on file  . Highest education level: Not on file  Occupational History  . Not on file  Social Needs  . Financial resource strain: Not on file  . Food insecurity:    Worry: Not on file    Inability: Not on file  . Transportation needs:    Medical: Not on file    Non-medical: Not on file  Tobacco Use  . Smoking status: Current Every Day Smoker    Packs/day: 0.50    Years: 40.00    Pack years: 20.00    Types: Cigarettes    Last attempt to quit: 01/27/2017    Years since quitting:  1.9  . Smokeless tobacco: Never Used  Substance and Sexual Activity  . Alcohol use: No  . Drug use: No  . Sexual activity: Yes  Lifestyle  . Physical activity:    Days per week: Not on file    Minutes per session: Not on file  . Stress: Not on file  Relationships  . Social connections:    Talks on phone: Not on file    Gets together: Not on file    Attends religious service: Not on file    Active member of club or organization: Not on file    Attends meetings of clubs or organizations: Not on file    Relationship status: Not on file  . Intimate partner violence:    Fear of current or ex partner: Not on file    Emotionally abused: Not on file    Physically abused: Not on file    Forced sexual activity: Not on file  Other Topics Concern  . 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    Past Surgical History:  Procedure Laterality Date  . TUBAL LIGATION  1988    Family History  Problem Relation Age of Onset  . Heart disease Mother        hx chf, died at age66  . Heart disease Maternal Grandmother   . Heart attack Maternal Grandfather   . Colon cancer Neg Hx     No Known Allergies  No current outpatient medications on file prior to visit.   Current Facility-Administered Medications on File Prior to Visit  Medication Dose Route Frequency Provider Last Rate Last Dose  . 0.9 %  sodium chloride infusion  500 mL Intravenous Continuous Nelida Meuse III, MD        Ht 5' 1.5" (1.562 m)   Wt 120 lb (54.4 kg)   LMP 08/15/2009   BMI 22.31 kg/m    Observations/Objective:   Gen: Awake, alert, no acute distress Resp: Breathing is even and  non-labored Psych: calm/pleasant demeanor Neuro: Alert and Oriented x 3, + facial symmetry, speech is clear.   Assessment and Plan:  HTN- BP above goal. Will increase toprol xl from 25mg  to 50mg  once daily. Continue current dose of losartan and amlodipine. Pt is advised to check HR and BP once daily for 1 week and send me her readings via mychart.  GERD- stable with prn use of nexium and dietary modification.  Tobacco abuse- not yet ready to quit. States she wants to start chantix when she is ready.  Follow Up Instructions:    I discussed the assessment and treatment plan with the patient. The patient was provided an opportunity to ask questions and all were answered. The patient agreed with the plan and demonstrated an understanding of the instructions.   The patient was advised to call back or seek an in-person evaluation if the symptoms worsen or if the condition fails to improve as anticipated.    Joy Pear, NP

## 2019-04-11 ENCOUNTER — Other Ambulatory Visit: Payer: Self-pay

## 2019-04-12 ENCOUNTER — Telehealth: Payer: Self-pay

## 2019-04-12 ENCOUNTER — Ambulatory Visit (INDEPENDENT_AMBULATORY_CARE_PROVIDER_SITE_OTHER): Payer: BC Managed Care – PPO | Admitting: Family

## 2019-04-12 ENCOUNTER — Encounter: Payer: Self-pay | Admitting: Family

## 2019-04-12 ENCOUNTER — Other Ambulatory Visit: Payer: Self-pay

## 2019-04-12 VITALS — BP 146/71 | HR 63 | Temp 97.8°F | Resp 16 | Ht 61.0 in | Wt 121.8 lb

## 2019-04-12 DIAGNOSIS — K219 Gastro-esophageal reflux disease without esophagitis: Secondary | ICD-10-CM

## 2019-04-12 DIAGNOSIS — I10 Essential (primary) hypertension: Secondary | ICD-10-CM

## 2019-04-12 LAB — BASIC METABOLIC PANEL
BUN: 10 mg/dL (ref 6–23)
CO2: 29 mEq/L (ref 19–32)
Calcium: 9.6 mg/dL (ref 8.4–10.5)
Chloride: 103 mEq/L (ref 96–112)
Creatinine, Ser: 0.75 mg/dL (ref 0.40–1.20)
GFR: 78.54 mL/min (ref >60.00)
Glucose, Bld: 86 mg/dL (ref 70–99)
Potassium: 4.3 mEq/L (ref 3.5–5.1)
Sodium: 139 mEq/L (ref 135–145)

## 2019-04-12 MED ORDER — LOSARTAN POTASSIUM-HCTZ 100-25 MG PO TABS: 1.0000 | ORAL_TABLET | Freq: Every day | ORAL | 3 refills | Status: DC

## 2019-04-12 MED ORDER — METOPROLOL SUCCINATE ER 50 MG PO TB24: 50.0000 mg | ORAL_TABLET | Freq: Every day | ORAL | Status: DC

## 2019-04-12 MED ORDER — NEBIVOLOL HCL 10 MG PO TABS: 10.0000 mg | ORAL_TABLET | Freq: Every day | ORAL | 3 refills | Status: DC

## 2019-04-12 NOTE — Progress Notes (Signed)
Subjective:    Patient ID: Joy Olson, female    DOB: [birth year], 63 y.o.   MRN: [id number]  HPI  Patient is a 64 year old female who presents today for routine follow-up.  Hypertension- current blood pressure medication includes amlodipine 10 mg, and losartan 100 mg daily, and Toprol-XL 50 mg once daily. Denies CP/SOB or swelling.   BP Readings from Last 3 Encounters:  04/12/19 (!) 146/71  10/09/18 (!) 151/70  01/24/18 (!) 142/78    GERD-patient continues Nexium 40 mg once daily as needed.  Usually takes about 2-3 times a week.      Review of Systems    see HPI  Past Medical History:  Diagnosis Date  . Borderline diabetes 07/26/2016  . Hypertension   . Osteopenia 02/02/2016     Social History   Socioeconomic History  . Marital status: Married    Spouse name: Not on file  . Number of children: 2  . Years of education: Not on file  . Highest education level: Not on file  Occupational History  . Not on file  Social Needs  . Financial resource strain: Not on file  . Food insecurity    Worry: Not on file    Inability: Not on file  . Transportation needs    Medical: Not on file    Non-medical: Not on file  Tobacco Use  . Smoking status: Current Every Day Smoker    Packs/day: 0.50    Years: 40.00    Pack years: 20.00    Types: Cigarettes    Last attempt to quit: 01/27/2017    Years since quitting: 2.2  . Smokeless tobacco: Never Used  Substance and Sexual Activity  . Alcohol use: No  . Drug use: No  . Sexual activity: Yes  Lifestyle  . Physical activity    Days per week: Not on file    Minutes per session: Not on file  . Stress: Not on file  Relationships  . Social Herbalist on phone: Not on file    Gets together: Not on file    Attends religious service: Not on file    Active member of club or organization: Not on file    Attends meetings of clubs or organizations: Not on file    Relationship status: Not on file  . Intimate  partner violence    Fear of current or ex partner: Not on file    Emotionally abused: Not on file    Physically abused: Not on file    Forced sexual activity: Not on file  Other Topics Concern  . 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 Worked there for 63 yrs   Married 90 years- husband is Office manager   Enjoys working in the yard (gypsy garden) reading, watching movies   Grew up in [place]    Past Surgical History:  Procedure Laterality Date  . TUBAL LIGATION  1988    Family History  Problem Relation Age of Onset  . Heart disease Mother        hx chf, died at age69  . Heart disease Maternal Grandmother   . Heart attack Maternal Grandfather   . Colon cancer Neg Hx     No Known Allergies  Current Outpatient Medications on  File Prior to Visit  Medication Sig Dispense Refill  . amLODipine (NORVASC) 10 MG tablet Take 1 tablet (10 mg total) by mouth daily. 90 tablet 1  . esomeprazole (NEXIUM) 40 MG capsule Take 1 capsule (40 mg total) by mouth daily as needed. 90 capsule 1  . losartan (COZAAR) 100 MG tablet Take 1 tablet (100 mg total) by mouth daily. 90 tablet 1  . metoprolol succinate (TOPROL-XL) 50 MG 24 hr tablet Take 1 tablet (50 mg total) by mouth daily. Take with or immediately following a meal. 90 tablet 1   Current Facility-Administered Medications on File Prior to Visit  Medication Dose Route Frequency Provider Last Rate Last Dose  . 0.9 %  sodium chloride infusion  500 mL Intravenous Continuous Danis, Estill Cotta III, MD        BP (!) 146/71 (BP Location: Right Arm, Patient Position: Sitting, Cuff Size: Small)   Pulse 63   Temp 97.8 F (36.6 C) (Temporal)   Resp 16   Ht 5\' 1"  (1.549 m)   Wt 121 lb 12.8 oz (55.2 kg)   LMP 08/15/2009   SpO2 100%   BMI 23.01 kg/m    Objective:   Physical  Exam Constitutional:      Appearance: She is well-developed.  Neck:     Musculoskeletal: Neck supple.     Thyroid: No thyromegaly.  Cardiovascular:     Rate and Rhythm: Normal rate and regular rhythm.     Heart sounds: Normal heart sounds. No murmur.  Pulmonary:     Effort: Pulmonary effort is normal. No respiratory distress.     Breath sounds: Normal breath sounds. No wheezing.  Skin:    General: Skin is warm and dry.  Neurological:     Mental Status: She is alert and oriented to person, place, and time.  Psychiatric:        Behavior: Behavior normal.        Thought Content: Thought content normal.        Judgment: Judgment normal.           Assessment & Plan:  HTN-  bp remains slightly above goal.  She is on max dose of amlodipine and losartan.  I do not want to increase her metoprolol due to heart rate of 63.  She hopes not to add a fourth medication.  Will plan to discontinue metoprolol and give trial of Bystolic.  We can titrate upwards as needed.  Patient to send me her heart rate and blood pressure results via my chart after she began Bystolic.  GERD-stable on as needed Nexium.

## 2019-04-12 NOTE — Patient Instructions (Signed)
Please complete lab work prior to leaving. Stop Metoprolol when you are able to get Bystolic. Check blood pressure and heart rate once daily after you begin bystolic and send me your readings via mychart.

## 2019-04-12 NOTE — Telephone Encounter (Signed)
Insurance not Engineer, site- no alternatives given.

## 2019-04-12 NOTE — Telephone Encounter (Signed)
Please advise pt that insurance won't cover bystolic.  Recommend that she continue metoprolol and change losartan to losartan HCTZ.  [facility]. Follow up in 2 weeks for nurse visit bp check and follow up bmet.

## 2019-04-15 NOTE — Telephone Encounter (Signed)
Patient advised of medication change. She was scheduled to come back on 04-30-2019 for bmet and bp check.

## 2019-04-16 DIAGNOSIS — H5213 Myopia, bilateral: Secondary | ICD-10-CM | POA: Diagnosis not present

## 2019-04-16 DIAGNOSIS — H2513 Age-related nuclear cataract, bilateral: Secondary | ICD-10-CM | POA: Diagnosis not present

## 2019-04-30 ENCOUNTER — Other Ambulatory Visit: Payer: BC Managed Care – PPO

## 2019-04-30 ENCOUNTER — Ambulatory Visit: Payer: BC Managed Care – PPO

## 2019-05-07 ENCOUNTER — Ambulatory Visit (INDEPENDENT_AMBULATORY_CARE_PROVIDER_SITE_OTHER): Payer: BC Managed Care – PPO | Admitting: Family

## 2019-05-07 ENCOUNTER — Telehealth: Payer: Self-pay | Admitting: *Deleted

## 2019-05-07 ENCOUNTER — Other Ambulatory Visit: Payer: BC Managed Care – PPO

## 2019-05-07 ENCOUNTER — Other Ambulatory Visit (INDEPENDENT_AMBULATORY_CARE_PROVIDER_SITE_OTHER): Payer: BC Managed Care – PPO

## 2019-05-07 ENCOUNTER — Other Ambulatory Visit: Payer: Self-pay

## 2019-05-07 DIAGNOSIS — I10 Essential (primary) hypertension: Secondary | ICD-10-CM

## 2019-05-07 LAB — BASIC METABOLIC PANEL
BUN: 10 mg/dL (ref 6–23)
CO2: 30 mEq/L (ref 19–32)
Calcium: 9.7 mg/dL (ref 8.4–10.5)
Chloride: 100 mEq/L (ref 96–112)
Creatinine, Ser: 0.71 mg/dL (ref 0.40–1.20)
GFR: 83.65 mL/min (ref >60.00)
Glucose, Bld: 109 mg/dL — ABNORMAL HIGH (ref 70–99)
Potassium: 3.8 mEq/L (ref 3.5–5.1)
Sodium: 138 mEq/L (ref 135–145)

## 2019-05-07 NOTE — Telephone Encounter (Signed)
Pt has lab appt this morning. There are no future orders in Epic. Please place order. Thanks!

## 2019-05-07 NOTE — Telephone Encounter (Signed)
Orders entered

## 2019-05-07 NOTE — Progress Notes (Signed)
Patient here for blood pressure follow up.    Blood pressure at visit on 04/12/19 was 146/71.    Insurance would not cover Bystolic so was changed to Production assistant, radio.  Blood pressure today is: Right 148/74 pulse 54 Left 134/75 pulse 52  Per Melissa patient to continue same dose and will recheck at next appointment in December.  Patient notified of plan.

## 2019-07-15 ENCOUNTER — Other Ambulatory Visit: Payer: Self-pay

## 2019-07-16 ENCOUNTER — Ambulatory Visit (INDEPENDENT_AMBULATORY_CARE_PROVIDER_SITE_OTHER): Payer: BC Managed Care – PPO | Admitting: Family

## 2019-07-16 ENCOUNTER — Other Ambulatory Visit: Payer: Self-pay

## 2019-07-16 VITALS — BP 130/76 | HR 68 | Temp 98.1°F | Resp 16 | Ht 61.0 in | Wt 121.0 lb

## 2019-07-16 DIAGNOSIS — I10 Essential (primary) hypertension: Secondary | ICD-10-CM | POA: Diagnosis not present

## 2019-07-16 DIAGNOSIS — K219 Gastro-esophageal reflux disease without esophagitis: Secondary | ICD-10-CM | POA: Diagnosis not present

## 2019-07-16 DIAGNOSIS — Z0000 Encounter for general adult medical examination without abnormal findings: Secondary | ICD-10-CM

## 2019-07-16 MED ORDER — LOSARTAN POTASSIUM-HCTZ 100-25 MG PO TABS: 1.0000 | ORAL_TABLET | Freq: Every day | ORAL | 1 refills | Status: DC

## 2019-07-16 MED ORDER — METOPROLOL SUCCINATE ER 50 MG PO TB24: 50.0000 mg | ORAL_TABLET | Freq: Every day | ORAL | 1 refills | Status: DC

## 2019-07-16 MED ORDER — ESOMEPRAZOLE MAGNESIUM 40 MG PO CPDR: 40.0000 mg | DELAYED_RELEASE_CAPSULE | Freq: Every day | ORAL | 1 refills | Status: DC | PRN

## 2019-07-16 MED ORDER — AMLODIPINE BESYLATE 10 MG PO TABS: 10.0000 mg | ORAL_TABLET | Freq: Every day | ORAL | 1 refills | Status: DC

## 2019-07-16 NOTE — Progress Notes (Signed)
Subjective:    Patient ID: Joy Olson, female    DOB: 08/22/1955, 63 y.o.   MRN: [id number]  HPI   Patient is a 63 yr old female who presents today for follow up.   HTN- maintained on amlodipine 10mg , losartan-hctz, toprol xl 50mg . Reports that she is tolerating her medications.  No concerns .  BP Readings from Last 3 Encounters:  07/16/19 130/76  04/12/19 (!) 146/71  10/09/18 (!) 151/70   GERD- stable on prn nexium  Report that her symptoms "come and go."   Review of Systems See HPI  Past Medical History:  Diagnosis Date  . Borderline diabetes 07/26/2016  . Hypertension   . Osteopenia 02/02/2016     Social History   Socioeconomic History  . Marital status: Married    Spouse name: Not on file  . Number of children: 2  . Years of education: Not on file  . Highest education level: Not on file  Occupational History  . Not on file  Social Needs  . Financial resource strain: Not on file  . Food insecurity    Worry: Not on file    Inability: Not on file  . Transportation needs    Medical: Not on file    Non-medical: Not on file  Tobacco Use  . Smoking status: Current Every Day Smoker    Packs/day: 0.50    Years: 40.00    Pack years: 20.00    Types: Cigarettes    Last attempt to quit: 01/27/2017    Years since quitting: 2.4  . Smokeless tobacco: Never Used  Substance and Sexual Activity  . Alcohol use: No  . Drug use: No  . Sexual activity: Yes  Lifestyle  . Physical activity    Days per week: Not on file    Minutes per session: Not on file  . Stress: Not on file  Relationships  . Social Herbalist on phone: Not on file    Gets together: Not on file    Attends religious service: Not on file    Active member of club or organization: Not on file    Attends meetings of clubs or organizations: Not on file    Relationship status: Not on file  . Intimate partner violence    Fear of current or ex partner: Not on file    Emotionally abused:  Not on file    Physically abused: Not on file    Forced sexual activity: Not on file  Other Topics Concern  . 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 Worked there for 5 yrs   Married 80 years- husband is Office manager   Enjoys working in the yard (gypsy garden) reading, watching movies   Grew up in [place]    Past Surgical History:  Procedure Laterality Date  . TUBAL LIGATION  1988    Family History  Problem Relation Age of Onset  . Heart disease Mother        hx chf, died at age72  . Heart disease Maternal Grandmother   . Heart attack Maternal Grandfather   . Colon cancer Neg Hx     No Known Allergies  Current Outpatient Medications on File Prior to Visit  Medication Sig Dispense Refill  . amLODipine (NORVASC)  10 MG tablet Take 1 tablet (10 mg total) by mouth daily. 90 tablet 1  . esomeprazole (NEXIUM) 40 MG capsule Take 1 capsule (40 mg total) by mouth daily as needed. 90 capsule 1  . losartan-hydrochlorothiazide (HYZAAR) 100-25 MG tablet Take 1 tablet by mouth daily. 30 tablet 3  . metoprolol succinate (TOPROL XL) 50 MG 24 hr tablet Take 1 tablet (50 mg total) by mouth daily. Take with or immediately following a meal.     Current Facility-Administered Medications on File Prior to Visit  Medication Dose Route Frequency Provider Last Rate Last Dose  . 0.9 %  sodium chloride infusion  500 mL Intravenous Continuous Danis, Estill Cotta III, MD        BP 130/76 (BP Location: Right Arm, Patient Position: Sitting, Cuff Size: Small)   Pulse 68   Temp 98.1 F (36.7 C) (Temporal)   Resp 16   Ht 5\' 1"  (1.549 m)   Wt 121 lb (54.9 kg)   LMP 08/15/2009   SpO2 99%   BMI 22.86 kg/m       Objective:   Physical Exam Constitutional:      Appearance: She is well-developed.  Cardiovascular:     Rate and  Rhythm: Normal rate and regular rhythm.     Heart sounds: Normal heart sounds. No murmur.  Pulmonary:     Effort: Pulmonary effort is normal. No respiratory distress.     Breath sounds: Normal breath sounds. No wheezing.  Psychiatric:        Behavior: Behavior normal.        Thought Content: Thought content normal.        Judgment: Judgment normal.           Assessment & Plan:  HTN- bp stable on current regimen, continue same.  GERD- stable. Continue nexium prn.

## 2019-07-24 ENCOUNTER — Ambulatory Visit (HOSPITAL_BASED_OUTPATIENT_CLINIC_OR_DEPARTMENT_OTHER)
Admission: RE | Admit: 2019-07-24 | Discharge: 2019-07-24 | Disposition: A | Discharge status: Home / Self Care | Payer: BC Managed Care – PPO | Source: Ambulatory Visit | Attending: Family | Admitting: Family

## 2019-07-24 ENCOUNTER — Other Ambulatory Visit: Payer: Self-pay

## 2019-07-24 DIAGNOSIS — Z0000 Encounter for general adult medical examination without abnormal findings: Secondary | ICD-10-CM | POA: Insufficient documentation

## 2019-07-24 DIAGNOSIS — Z1231 Encounter for screening mammogram for malignant neoplasm of breast: Secondary | ICD-10-CM | POA: Diagnosis not present

## 2019-10-18 ENCOUNTER — Ambulatory Visit: Payer: BC Managed Care – PPO | Attending: Internal Medicine

## 2019-10-18 DIAGNOSIS — Z23 Encounter for immunization: Secondary | ICD-10-CM

## 2019-10-18 NOTE — Progress Notes (Signed)
   Covid-19 Vaccination Clinic  Name:  Joy Olson    MRN: [id number] DOB: [birth year]  10/18/2019  [name] was observed post Covid-19 immunization for 15 minutes without incident. She was provided with Vaccine Information Sheet and instruction to access the V-Safe system.   [name] was instructed to call 911 with any severe reactions post vaccine: Marland Kitchen Difficulty breathing  . Swelling of face and throat  . A fast heartbeat  . A bad rash all over body  . Dizziness and weakness

## 2019-11-13 ENCOUNTER — Ambulatory Visit: Payer: BC Managed Care – PPO | Attending: Internal Medicine

## 2019-11-13 DIAGNOSIS — Z23 Encounter for immunization: Secondary | ICD-10-CM

## 2019-11-13 NOTE — Progress Notes (Signed)
   Covid-19 Vaccination Clinic  Name:  Hendy Sonne    MRN: [id number] DOB: [birth year]  11/13/2019  Ms. Wurtzel was observed post Covid-19 immunization for 15 minutes without incident. She was provided with Vaccine Information Sheet and instruction to access the V-Safe system.   Ms. Breslin was instructed to call 911 with any severe reactions post vaccine: Marland Kitchen Difficulty breathing  . Swelling of face and throat  . A fast heartbeat  . A bad rash all over body  . Dizziness and weakness   Immunizations Administered    Name Date Dose VIS Date Route   Pfizer COVID-19 Vaccine 11/13/2019 12:56 PM 0.3 mL 07/26/2019 Intramuscular   Manufacturer: [place]   Lot: [id number]   [place]: [id number]

## 2020-01-14 ENCOUNTER — Other Ambulatory Visit: Payer: Self-pay | Admitting: Family

## 2020-01-14 ENCOUNTER — Other Ambulatory Visit: Payer: Self-pay

## 2020-01-14 ENCOUNTER — Ambulatory Visit (INDEPENDENT_AMBULATORY_CARE_PROVIDER_SITE_OTHER): Payer: BC Managed Care – PPO | Admitting: Family

## 2020-01-14 ENCOUNTER — Encounter: Payer: Self-pay | Admitting: Family

## 2020-01-14 VITALS — BP 138/78 | HR 82 | Resp 17 | Ht 61.0 in | Wt 118.0 lb

## 2020-01-14 DIAGNOSIS — I10 Essential (primary) hypertension: Secondary | ICD-10-CM

## 2020-01-14 DIAGNOSIS — R1013 Epigastric pain: Secondary | ICD-10-CM

## 2020-01-14 LAB — COMPREHENSIVE METABOLIC PANEL
ALT: 11 U/L (ref 0–35)
AST: 13 U/L (ref 0–37)
Albumin: 4.5 g/dL (ref 3.5–5.2)
Alkaline Phosphatase: 79 U/L (ref 39–117)
BUN: 12 mg/dL (ref 6–23)
CO2: 30 mEq/L (ref 19–32)
Calcium: 9.7 mg/dL (ref 8.4–10.5)
Chloride: 99 mEq/L (ref 96–112)
Creatinine, Ser: 0.73 mg/dL (ref 0.40–1.20)
GFR: 80.83 mL/min (ref >60.00)
Glucose, Bld: 94 mg/dL (ref 70–99)
Potassium: 4.4 mEq/L (ref 3.5–5.1)
Sodium: 138 mEq/L (ref 135–145)
Total Bilirubin: 0.4 mg/dL (ref 0.2–1.2)
Total Protein: 6.7 g/dL (ref 6.0–8.3)

## 2020-01-14 LAB — LIPASE: Lipase: 28 U/L (ref 11.0–59.0)

## 2020-01-14 MED ORDER — METOPROLOL SUCCINATE ER 50 MG PO TB24: 50.0000 mg | ORAL_TABLET | Freq: Every day | ORAL | 1 refills | Status: DC

## 2020-01-14 MED ORDER — ESOMEPRAZOLE MAGNESIUM 40 MG PO CPDR: 40.0000 mg | DELAYED_RELEASE_CAPSULE | Freq: Every day | ORAL | 1 refills | Status: DC | PRN

## 2020-01-14 MED ORDER — AMLODIPINE BESYLATE 10 MG PO TABS: 10.0000 mg | ORAL_TABLET | Freq: Every day | ORAL | 1 refills | Status: DC

## 2020-01-14 MED ORDER — LOSARTAN POTASSIUM-HCTZ 100-25 MG PO TABS: 1.0000 | ORAL_TABLET | Freq: Every day | ORAL | 1 refills | Status: DC

## 2020-01-14 NOTE — Progress Notes (Signed)
Subjective:    Patient ID: Joy Olson, female    DOB: 1956-01-04, 64 y.o.   MRN: [id number]  HPI  Patient is a 64 yr old female who presents today for follow up.  HTN- maintained on amlodipine, losartan-hctz, toprol.  BP Readings from Last 3 Encounters:  01/14/20 138/78  07/16/19 130/76  04/12/19 (!) 146/71   Abdominal pain- reports that the pain has happened 4 times in the last 2 months. Pain is epigastric, and excruciating when it occurs. Denies associated nausea. Pain resolves 30-45 minutes after she takes nexium.  Reports that she has reduced her coffee, eating smaller meals.    Review of Systems See HPI  Past Medical History:  Diagnosis Date   Borderline diabetes 07/26/2016   Hypertension    Osteopenia 02/02/2016     Social History   Socioeconomic History   Marital status: Married    Spouse name: Not on file   Number of children: 2   Years of education: Not on file   Highest education level: Not on file  Occupational History   Not on file  Tobacco Use   Smoking status: Current Every Day Smoker    Packs/day: 0.50    Years: 40.00    Pack years: 20.00    Types: Cigarettes    Last attempt to quit: 01/27/2017    Years since quitting: 2.9   Smokeless tobacco: Never Used  Substance and Sexual Activity   Alcohol use: No   Drug use: No   Sexual activity: Yes  Other Topics Concern   Not on file  Social History Narrative   Son born 60 lives in [place] live in [place]   3 grandchildren (2 boys and girl)   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 Worked there for 67 yrs   Married 83 years- husband is Office manager   Enjoys working in the yard (gypsy garden) reading, watching movies   Grew up in [place] Strain:    Difficulty of Paying Living Expenses:   Food Insecurity:    Worried About Charity fundraiser in the Last Year:    Academic librarian in the Last Year:   Transportation Needs:    Film/video editor (Medical):    Lack of Transportation (Non-Medical):   Physical Activity:    Days of Exercise per Week:    Minutes of Exercise per Session:   Stress:    Feeling of Stress :   Social Connections:    Frequency of Communication with Friends and Family:    Frequency of Social Gatherings with Friends and Family:    Attends Religious Services:    Active Member of Clubs or Organizations:    Attends Music therapist:    Marital Status:   Intimate Partner Violence:    Fear of Current or Ex-Partner:    Emotionally Abused:    Physically Abused:    Sexually Abused:     Past Surgical History:  Procedure Laterality Date   TUBAL LIGATION  1988    Family History  Problem Relation Age of Onset   Heart disease Mother        hx chf, died at age64   Heart disease Maternal Grandmother    Heart attack Maternal Grandfather    Colon cancer Neg Hx     No Known Allergies  No current outpatient medications on file prior  to visit.   Current Facility-Administered Medications on File Prior to Visit  Medication Dose Route Frequency Provider Last Rate Last Admin   0.9 %  sodium chloride infusion  500 mL Intravenous Continuous Danis, Estill Cotta III, MD        BP 138/78 (BP Location: Right Arm, Patient Position: Sitting, Cuff Size: Normal)    Pulse 82    Resp 17    Ht 5\' 1"  (1.549 m)    Wt 118 lb (53.5 kg)    LMP 08/15/2009    SpO2 98%    BMI 22.30 kg/m       Objective:   Physical Exam Constitutional:      Appearance: She is well-developed.  Neck:     Thyroid: No thyromegaly.  Cardiovascular:     Rate and Rhythm: Normal rate and regular rhythm.     Heart sounds: Normal heart sounds. No murmur.  Pulmonary:     Effort: Pulmonary effort is normal. No respiratory distress.     Breath sounds: Normal breath sounds. No wheezing.  Abdominal:     General: Bowel sounds are normal.      Palpations: Abdomen is soft.     Tenderness: There is no abdominal tenderness.  Musculoskeletal:     Cervical back: Neck supple.  Skin:    General: Skin is warm and dry.  Neurological:     Mental Status: She is alert and oriented to person, place, and time.  Psychiatric:        Behavior: Behavior normal.        Thought Content: Thought content normal.        Judgment: Judgment normal.           Assessment & Plan:  Epigastric pain- New. Will obtain lipase, LFT, abdominal US to further evaluate.  Continue PPI. If pain persists and work up unrevealing, plan referral to GI.  HTN- bp stable on current meds. Continue same.   This visit occurred during the SARS-CoV-2 public health emergency.  Safety protocols were in place, including screening questions prior to the visit, additional usage of staff PPE, and extensive cleaning of exam room while observing appropriate contact time as indicated for disinfecting solutions.

## 2020-01-14 NOTE — Patient Instructions (Signed)
Please complete lab work prior to leaving.   

## 2020-01-16 ENCOUNTER — Other Ambulatory Visit: Payer: Self-pay

## 2020-01-16 ENCOUNTER — Ambulatory Visit (HOSPITAL_BASED_OUTPATIENT_CLINIC_OR_DEPARTMENT_OTHER)
Admission: RE | Admit: 2020-01-16 | Discharge: 2020-01-16 | Disposition: A | Discharge status: Home / Self Care | Payer: BC Managed Care – PPO | Source: Ambulatory Visit | Attending: Family | Admitting: Family

## 2020-01-16 DIAGNOSIS — R1013 Epigastric pain: Secondary | ICD-10-CM | POA: Diagnosis not present

## 2020-01-16 DIAGNOSIS — K8020 Calculus of gallbladder without cholecystitis without obstruction: Secondary | ICD-10-CM | POA: Diagnosis not present

## 2020-01-17 ENCOUNTER — Encounter: Payer: Self-pay | Admitting: Family

## 2020-02-03 ENCOUNTER — Other Ambulatory Visit: Payer: Self-pay

## 2020-02-03 ENCOUNTER — Encounter: Payer: Self-pay | Admitting: Family

## 2020-02-03 ENCOUNTER — Ambulatory Visit (INDEPENDENT_AMBULATORY_CARE_PROVIDER_SITE_OTHER): Payer: BC Managed Care – PPO | Admitting: Family

## 2020-02-03 VITALS — BP 130/68 | HR 61 | Temp 97.7°F | Resp 16 | Ht 61.0 in | Wt 119.0 lb

## 2020-02-03 DIAGNOSIS — K8020 Calculus of gallbladder without cholecystitis without obstruction: Secondary | ICD-10-CM

## 2020-02-03 DIAGNOSIS — K219 Gastro-esophageal reflux disease without esophagitis: Secondary | ICD-10-CM

## 2020-02-03 HISTORY — DX: Calculus of gallbladder without cholecystitis without obstruction: K80.20

## 2020-02-03 NOTE — Progress Notes (Signed)
Subjective:    Patient ID: Joy Olson, female    DOB: October 16, 1955, 64 y.o.   MRN: [id number]  HPI  Patient is a 64 yr old female who presents today for follow up. We last saw her on 6/1.   Epigastric pain- last visit we obtained an abdominal ultrasound which noted cholelithiasis. She reports resolution of her symptoms. Reports that she has been careful with what she has been eating and drinking. She reports that coffee and diet coke seem to worsen her symptoms. She continues nexium.  Reports that her epigastric pain is resolved.   Review of Systems    see HPI  Past Medical History:  Diagnosis Date  . Borderline diabetes 07/26/2016  . Hypertension   . Osteopenia 02/02/2016     Social History   Socioeconomic History  . Marital status: Married    Spouse name: Not on file  . Number of children: 2  . Years of education: Not on file  . Highest education level: Not on file  Occupational History  . Not on file  Tobacco Use  . Smoking status: Current Every Day Smoker    Packs/day: 0.50    Years: 40.00    Pack years: 20.00    Types: Cigarettes    Last attempt to quit: 01/27/2017    Years since quitting: 3.0  . Smokeless tobacco: Never Used  Substance and Sexual Activity  . Alcohol use: No  . Drug use: No  . Sexual activity: Yes  Other Topics Concern  . 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 Worked there for 22 yrs   Married 26 years- husband is Office manager   Enjoys working in the yard (gypsy garden) reading, watching movies   Grew up in [place] Strain:   . Difficulty of Paying Living Expenses:   Food Insecurity:   . Worried About Charity fundraiser in the Last Year:   . Arboriculturist in the Last Year:   Transportation Needs:   . Consulting civil engineer (Medical):   Marland Kitchen Lack of Transportation (Non-Medical):   Physical Activity:   . Days of Exercise per Week:   . Minutes of Exercise per Session:   Stress:   . Feeling of Stress :   Social Connections:   . Frequency of Communication with Friends and Family:   . Frequency of Social Gatherings with Friends and Family:   . Attends Religious Services:   . Active Member of Clubs or Organizations:   . Attends Archivist Meetings:   Marland Kitchen Marital Status:   Intimate Partner Violence:   . Fear of Current or Ex-Partner:   . Emotionally Abused:   Marland Kitchen Physically Abused:   . Sexually Abused:     Past Surgical History:  Procedure Laterality Date  . TUBAL LIGATION  1988    Family History  Problem Relation Age of Onset  . Heart disease Mother        hx chf, died at age36  . Heart disease Maternal Grandmother   . Heart attack Maternal Grandfather   . Colon cancer Neg Hx     No Known Allergies  Current Outpatient Medications on File Prior to Visit  Medication Sig Dispense Refill  .  amLODipine (NORVASC) 10 MG tablet Take 1 tablet (10 mg total) by mouth daily. 90 tablet 1  . esomeprazole (NEXIUM) 40 MG capsule Take 1 capsule (40 mg total) by mouth daily as needed. 90 capsule 1  . losartan-hydrochlorothiazide (HYZAAR) 100-25 MG tablet Take 1 tablet by mouth daily. 90 tablet 1  . metoprolol succinate (TOPROL XL) 50 MG 24 hr tablet Take 1 tablet (50 mg total) by mouth daily. Take with or immediately following a meal. 90 tablet 1   Current Facility-Administered Medications on File Prior to Visit  Medication Dose Route Frequency Provider Last Rate Last Admin  . 0.9 %  sodium chloride infusion  500 mL Intravenous Continuous Danis, Estill Cotta III, MD        BP 130/68 (BP Location: Right Arm, Patient Position: Sitting, Cuff Size: Small)   Pulse 61   Temp 97.7 F (36.5 C) (Temporal)   Resp 16   Ht 5\' 1"  (1.549 m)   Wt 119 lb (54 kg)   LMP 08/15/2009   SpO2 100%   BMI 22.48  kg/m    Objective:   Physical Exam Constitutional:      Appearance: She is well-developed.  Neck:     Thyroid: No thyromegaly.  Cardiovascular:     Rate and Rhythm: Normal rate and regular rhythm.     Heart sounds: Normal heart sounds. No murmur heard.   Pulmonary:     Effort: Pulmonary effort is normal. No respiratory distress.     Breath sounds: Normal breath sounds. No wheezing.  Abdominal:     General: Bowel sounds are normal.     Palpations: Abdomen is soft.     Tenderness: There is no abdominal tenderness.  Musculoskeletal:     Cervical back: Neck supple.  Skin:    General: Skin is warm and dry.  Neurological:     Mental Status: She is alert and oriented to person, place, and time.  Psychiatric:        Behavior: Behavior normal.        Thought Content: Thought content normal.        Judgment: Judgment normal.           Assessment & Plan:  Cholelithiasis- currently asymptomatic. Lab work was normal last visit. We discussed referral to a general surgeon to discuss possible need for non-urgent cholecystectomy. She wishes to wait and would like to re-consider if she has recurrent symptoms.  GERD- stable on PPI. Will continue the same.  This visit occurred during the SARS-CoV-2 public health emergency.  Safety protocols were in place, including screening questions prior to the visit, additional usage of staff PPE, and extensive cleaning of exam room while observing appropriate contact time as indicated for disinfecting solutions.

## 2020-02-03 NOTE — Patient Instructions (Addendum)
Please call if you develop recurrent abdominal pain. Go to the ER if abdominal pain is severe.

## 2020-04-23 DIAGNOSIS — H524 Presbyopia: Secondary | ICD-10-CM | POA: Diagnosis not present

## 2020-04-23 DIAGNOSIS — H2513 Age-related nuclear cataract, bilateral: Secondary | ICD-10-CM | POA: Diagnosis not present

## 2020-06-03 ENCOUNTER — Encounter: Payer: Self-pay | Admitting: Family

## 2020-06-03 DIAGNOSIS — K819 Cholecystitis, unspecified: Secondary | ICD-10-CM

## 2020-06-30 DIAGNOSIS — K8020 Calculus of gallbladder without cholecystitis without obstruction: Secondary | ICD-10-CM | POA: Diagnosis not present

## 2020-08-04 ENCOUNTER — Ambulatory Visit (INDEPENDENT_AMBULATORY_CARE_PROVIDER_SITE_OTHER): Payer: BC Managed Care – PPO | Admitting: Family

## 2020-08-04 ENCOUNTER — Encounter: Payer: Self-pay | Admitting: Family

## 2020-08-04 ENCOUNTER — Telehealth: Payer: Self-pay | Admitting: Family

## 2020-08-04 ENCOUNTER — Other Ambulatory Visit: Payer: Self-pay

## 2020-08-04 VITALS — BP 124/70 | HR 72 | Temp 98.6°F | Resp 18 | Ht 61.0 in | Wt 118.8 lb

## 2020-08-04 DIAGNOSIS — K219 Gastro-esophageal reflux disease without esophagitis: Secondary | ICD-10-CM | POA: Diagnosis not present

## 2020-08-04 DIAGNOSIS — I10 Essential (primary) hypertension: Secondary | ICD-10-CM | POA: Diagnosis not present

## 2020-08-04 DIAGNOSIS — K8020 Calculus of gallbladder without cholecystitis without obstruction: Secondary | ICD-10-CM

## 2020-08-04 DIAGNOSIS — F17210 Nicotine dependence, cigarettes, uncomplicated: Secondary | ICD-10-CM

## 2020-08-04 LAB — COMPREHENSIVE METABOLIC PANEL
ALT: 225 U/L — ABNORMAL HIGH (ref 0–35)
AST: 74 U/L — ABNORMAL HIGH (ref 0–37)
Albumin: 4.6 g/dL (ref 3.5–5.2)
Alkaline Phosphatase: 118 U/L — ABNORMAL HIGH (ref 39–117)
BUN: 9 mg/dL (ref 6–23)
CO2: 31 mEq/L (ref 19–32)
Calcium: 9.9 mg/dL (ref 8.4–10.5)
Chloride: 101 mEq/L (ref 96–112)
Creatinine, Ser: 0.82 mg/dL (ref 0.40–1.20)
GFR: 76.69 mL/min (ref >60.00)
Glucose, Bld: 93 mg/dL (ref 70–99)
Potassium: 4.1 mEq/L (ref 3.5–5.1)
Sodium: 138 mEq/L (ref 135–145)
Total Bilirubin: 0.8 mg/dL (ref 0.2–1.2)
Total Protein: 7.1 g/dL (ref 6.0–8.3)

## 2020-08-04 NOTE — Patient Instructions (Signed)
Please complete lab work prior to leaving.   

## 2020-08-04 NOTE — Telephone Encounter (Signed)
See mychart.  

## 2020-08-04 NOTE — Progress Notes (Signed)
Subjective:    Patient ID: Joy Olson, female    DOB: August 20, 1955, 64 y.o.   MRN: [id number]  HPI  Patient is a 64 yr old female who presents today for follow up.   Cholelithiasis- has had symptoms previously and cholelithiasis on [place].  She had a consult at [facility].  She has been using apple cider vinegar with water and it seems to help.  Notes that symptoms are intermittent. Last attack was on 12/10, then another on 08/02/20. She is considering surgery after the new year.   HTN- maintained on amlodipine 10mg , toprol xl 50mg  and hyzaar 100-25mg .   BP Readings from Last 3 Encounters:  08/04/20 124/70  02/03/20 130/68  01/14/20 138/78   GERD- maintained on nexium 40mg  once daily. Reports symptoms are stable.    Tobacco abuse- still smoking.    Review of Systems See HPI  Past Medical History:  Diagnosis Date   Borderline diabetes 07/26/2016   Hypertension    Osteopenia 02/02/2016     Social History   Socioeconomic History   Marital status: Married    Spouse name: Not on file   Number of children: 2   Years of education: Not on file   Highest education level: Not on file  Occupational History   Not on file  Tobacco Use   Smoking status: Current Every Day Smoker    Packs/day: 0.50    Years: 40.00    Pack years: 20.00    Types: Cigarettes    Last attempt to quit: 01/27/2017    Years since quitting: 3.5   Smokeless tobacco: Never Used  Substance and Sexual Activity   Alcohol use: No   Drug use: No   Sexual activity: Yes  Other Topics Concern   Not on file  Social History Narrative   Son born 23 lives in [place] live in [place]   3 grandchildren (2 boys and girl)   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   Married 8 years- husband is Office manager   Enjoys working in the yard (gypsy garden) reading, watching movies   Grew up in [place] Strain: Not on [employer] Insecurity: Not on file  Transportation Needs: Not on file  Physical Activity: Not on file  Stress: Not on file  Social Connections: Not on file  Intimate Partner Violence: Not on file    Past Surgical History:  Procedure Laterality Date   TUBAL LIGATION  1988    Family History  Problem Relation Age of Onset   Heart disease Mother        hx chf, died at age64   Heart disease Maternal Grandmother    Heart attack Maternal Grandfather    Colon cancer Neg Hx     No Known Allergies  Current Outpatient Medications on File Prior to Visit  Medication Sig Dispense Refill   amLODipine (NORVASC) 10 MG tablet Take 1 tablet (10 mg total) by mouth daily. 90 tablet 1   esomeprazole (NEXIUM) 40 MG capsule Take 1 capsule (40 mg total) by mouth daily as needed. 90 capsule 1   losartan-hydrochlorothiazide (HYZAAR) 100-25 MG tablet Take 1 tablet by mouth daily. 90 tablet 1   metoprolol succinate (TOPROL XL) 50 MG 24 hr tablet Take 1 tablet (50 mg total) by mouth daily. Take with or immediately following a meal. 90 tablet  1   Current Facility-Administered Medications on File Prior to Visit  Medication Dose Route Frequency Provider Last Rate Last Admin   0.9 %  sodium chloride infusion  500 mL Intravenous Continuous Danis, Estill Cotta III, MD        BP 124/70 (BP Location: Left Arm, Patient Position: Sitting, Cuff Size: Normal)    Pulse 72    Temp 98.6 F (37 C) (Oral)    Resp 18    Ht 5\' 1"  (1.549 m)    Wt 118 lb 12.8 oz (53.9 kg)    LMP 08/15/2009    SpO2 98%    BMI 22.45 kg/m       Objective:   Physical Exam Constitutional:      Appearance: She is well-developed and well-nourished.  Neck:     Thyroid: No thyromegaly.  Cardiovascular:     Rate and Rhythm: Normal rate and regular rhythm.     Heart sounds: Normal heart sounds. No murmur heard.   Pulmonary:     Effort: Pulmonary effort is normal. No respiratory distress.      Breath sounds: Normal breath sounds. No wheezing.  Musculoskeletal:     Cervical back: Neck supple.  Skin:    General: Skin is warm and dry.  Neurological:     Mental Status: She is alert and oriented to person, place, and time.  Psychiatric:        Mood and Affect: Mood and affect normal.        Behavior: Behavior normal.        Thought Content: Thought content normal.        Judgment: Judgment normal.           Assessment & Plan:  HTN- bp stable. Continue amlodipine 10mg , toprol xl 50mg  and hyzaar 100-25mg .  Obtain follow up cmet.  Tobacco abuse- discussed smoking cessation.  Symptomatic cholelithiasis- encouraged her to consider cholecystectomy after the new year. She will contact her surgeon to schedule.  GERD- stable on nexium. Continue same.  This visit occurred during the SARS-CoV-2 public health emergency.  Safety protocols were in place, including screening questions prior to the visit, additional usage of staff PPE, and extensive cleaning of exam room while observing appropriate contact time as indicated for disinfecting solutions.

## 2020-08-31 NOTE — Pre-Procedure Instructions (Signed)
Your procedure is scheduled on Mon., Jan. 24, 2022 from 7:30AM-9:00AM  Report to [facility] Entrance "A" at 5:30AM  Call this number if you have problems the morning of surgery:  [phone]   Remember:  Do not eat or drink after midnight on Jan. 23rd    Take these medicines the morning of surgery with A SIP OF WATER: AmLODipine (NORVASC)  Metoprolol succinate (TOPROL XL)  If Needed: Esomeprazole (NEXIUM)  As of today, STOP taking all Aspirin (unless instructed by your doctor) and Other Aspirin containing products, Vitamins, Fish oils, and Herbal medications. Also stop all NSAIDS i.e. Advil, Ibuprofen, Motrin, Aleve, Anaprox, Naproxen, BC, Goody Powders, and all Supplements.   No Smoking of any kind, Tobacco/Vaping, or Alcohol products 24 hours prior to your procedure. If you use a Cpap at night, you may bring all equipment for your overnight stay.   Day of Surgery:  Do not wear jewelry, make-up or nail polish.  Do not wear lotions, powders, or perfumes, or deodorant.  Do not shave 48 hours prior to surgery.    Do not bring valuables to the hospital.  [facility] is not responsible for any belongings or valuables.  Contacts, dentures or bridgework may not be worn into surgery.   For patients admitted to the hospital, discharge time will be determined by your treatment team.  Patients discharged the day of surgery will not be allowed to drive home, and someone age 31 and over needs to stay with them for 24 hours.    Special instructions:  [facility]- Preparing For Surgery  Before surgery, you can play an important role. Because skin is not sterile, your skin needs to be as free of germs as possible. You can reduce the number of germs on your skin by washing with CHG (chlorahexidine gluconate) Soap before surgery.  CHG is an antiseptic cleaner which kills germs and bonds with the skin to continue killing germs even after washing.    Oral Hygiene is also important to  reduce your risk of infection.  Remember - BRUSH YOUR TEETH THE MORNING OF SURGERY WITH YOUR REGULAR TOOTHPASTE  Please do not use if you have an allergy to CHG or antibacterial soaps. If your skin becomes reddened/irritated stop using the CHG.  Do not shave (including legs and underarms) for at least 48 hours prior to first CHG shower. It is OK to shave your face.  Please follow these instructions carefully.   1. Shower the NIGHT BEFORE SURGERY and the MORNING OF SURGERY with CHG.   2. If you chose to wash your hair, wash your hair first as usual with your normal shampoo.  3. After you shampoo, rinse your hair and body thoroughly to remove the shampoo.  4. Use CHG as you would any other liquid soap. You can apply CHG directly to the skin and wash gently with a scrungie or a clean washcloth.   5. Apply the CHG Soap to your body ONLY FROM THE NECK DOWN.  Do not use on open wounds or open sores. Avoid contact with your eyes, ears, mouth and genitals (private parts). Wash Face and genitals (private parts)  with your normal soap.  6. Wash thoroughly, paying special attention to the area where your surgery will be performed.  7. Thoroughly rinse your body with warm water from the neck down.  8. DO NOT shower/wash with your normal soap after using and rinsing off the CHG Soap.  9. Pat yourself dry with a CLEAN  TOWEL.  10. Wear CLEAN PAJAMAS to bed the night before surgery, wear comfortable clothes the morning of surgery  11. Place CLEAN SHEETS on your bed the night of your first shower and DO NOT SLEEP WITH PETS.  Reminder: Please wear clean clothes to the hospital/surgery center.   Remember to brush your teeth WITH YOUR REGULAR TOOTHPASTE.  Please read over the following fact sheets that you were given.

## 2020-09-01 ENCOUNTER — Encounter (HOSPITAL_COMMUNITY)
Admission: RE | Admit: 2020-09-01 | Discharge: 2020-09-01 | Disposition: A | Discharge status: Home / Self Care | Payer: BC Managed Care – PPO | Source: Ambulatory Visit | Attending: Surgery | Admitting: Surgery

## 2020-09-01 ENCOUNTER — Ambulatory Visit: Payer: Self-pay | Admitting: Surgery

## 2020-09-01 ENCOUNTER — Other Ambulatory Visit: Payer: Self-pay

## 2020-09-01 ENCOUNTER — Encounter (HOSPITAL_COMMUNITY): Payer: Self-pay

## 2020-09-01 DIAGNOSIS — Z01818 Encounter for other preprocedural examination: Secondary | ICD-10-CM | POA: Insufficient documentation

## 2020-09-01 LAB — BASIC METABOLIC PANEL
Anion gap: 10 (ref 5–15)
BUN: 11 mg/dL (ref 8–23)
CO2: 27 mmol/L (ref 22–32)
Calcium: 9.4 mg/dL (ref 8.9–10.3)
Chloride: 99 mmol/L (ref 98–111)
Creatinine, Ser: 0.69 mg/dL (ref 0.44–1.00)
GFR, Estimated: >60 mL/min (ref >60)
Glucose, Bld: 93 mg/dL (ref 70–99)
Potassium: 3.5 mmol/L (ref 3.5–5.1)
Sodium: 136 mmol/L (ref 135–145)

## 2020-09-01 LAB — CBC
HCT: 42.3 % (ref 36.0–46.0)
Hemoglobin: 13.9 g/dL (ref 12.0–15.0)
MCH: 29.6 pg (ref 26.0–34.0)
MCHC: 32.9 g/dL (ref 30.0–36.0)
MCV: 90.2 fL (ref 80.0–100.0)
Platelets: 311 10*3/uL (ref 150–400)
RBC: 4.69 MIL/uL (ref 3.87–5.11)
RDW: 12.6 % (ref 11.5–15.5)
WBC: 11.2 10*3/uL — ABNORMAL HIGH (ref 4.0–10.5)
nRBC: 0 % (ref 0.0–0.2)

## 2020-09-01 NOTE — Progress Notes (Signed)
PCP - Debbrah Alar, NP Cardiologist - denies  PPM/ICD - denies  Chest x-ray - N/A EKG - 09/01/20 Stress Test - denies ECHO - denies Cardiac Cath - denies   Sleep Study - denies CPAP - N/A  DM: denies  Blood Thinner Instructions: N/A Aspirin Instructions: N/A  ERAS Protcol - No  COVID TEST- Scheduled for 09/03/20. Patient verbalized understanding of self-quarantine instructions, appointment time and place.  Anesthesia review: No  Patient denies shortness of breath, fever, cough and chest pain at PAT appointment  All instructions explained to the patient, with a verbal understanding of the material. Patient agrees to go over the instructions while at home for a better understanding. Patient also instructed to self quarantine after being tested for COVID-19. The opportunity to ask questions was provided.

## 2020-09-03 ENCOUNTER — Other Ambulatory Visit (HOSPITAL_COMMUNITY)
Admission: RE | Admit: 2020-09-03 | Discharge: 2020-09-03 | Disposition: A | Discharge status: Home / Self Care | Payer: BC Managed Care – PPO | Source: Ambulatory Visit | Attending: Surgery | Admitting: Surgery

## 2020-09-03 DIAGNOSIS — Z01812 Encounter for preprocedural laboratory examination: Secondary | ICD-10-CM | POA: Insufficient documentation

## 2020-09-03 DIAGNOSIS — Z20822 Contact with and (suspected) exposure to covid-19: Secondary | ICD-10-CM | POA: Insufficient documentation

## 2020-09-04 LAB — SARS CORONAVIRUS 2 (TAT 6-24 HRS): SARS Coronavirus 2: NEGATIVE

## 2020-09-06 NOTE — Anesthesia Preprocedure Evaluation (Addendum)
Anesthesia Evaluation  Patient identified by MRN, date of birth, ID band Patient awake    Reviewed: Allergy & Precautions, NPO status , Patient's Chart, lab work & pertinent test results  Airway Mallampati: II  TM Distance: >3 FB Neck ROM: Full    Dental  (+) Dental Advisory Given, Caps, Implants   Pulmonary Current Smoker,    Pulmonary exam normal breath sounds clear to auscultation       Cardiovascular hypertension, Pt. on medications and Pt. on home beta blockers Normal cardiovascular exam Rhythm:Regular Rate:Normal     Neuro/Psych negative neurological ROS     GI/Hepatic Neg liver ROS, GERD  ,  Endo/Other  negative endocrine ROS  Renal/GU negative Renal ROS     Musculoskeletal negative musculoskeletal ROS (+)   Abdominal   Peds  Hematology negative hematology ROS (+)   Anesthesia Other Findings   Reproductive/Obstetrics                            Anesthesia Physical Anesthesia Plan  ASA: II  Anesthesia Plan: General   Post-op Pain Management:    Induction: Intravenous  PONV Risk Score and Plan: 4 or greater and Aprepitant, Ondansetron, Dexamethasone, Midazolam, Treatment may vary due to age or medical condition and Scopolamine patch - Pre-op  Airway Management Planned: Oral ETT  Additional Equipment: None  Intra-op Plan:   Post-operative Plan: Extubation in OR  Informed Consent: I have reviewed the patients History and Physical, chart, labs and discussed the procedure including the risks, benefits and alternatives for the proposed anesthesia with the patient or authorized representative who has indicated his/her understanding and acceptance.     Dental advisory given  Plan Discussed with: CRNA  Anesthesia Plan Comments:        Anesthesia Quick Evaluation

## 2020-09-07 ENCOUNTER — Ambulatory Visit (HOSPITAL_COMMUNITY)
Admission: RE | Admit: 2020-09-07 | Discharge: 2020-09-07 | Disposition: A | Discharge status: Home / Self Care | Payer: BC Managed Care – PPO | Attending: Surgery | Admitting: Surgery

## 2020-09-07 ENCOUNTER — Other Ambulatory Visit (HOSPITAL_COMMUNITY): Payer: Self-pay | Admitting: Surgery

## 2020-09-07 ENCOUNTER — Ambulatory Visit (HOSPITAL_COMMUNITY): Payer: BC Managed Care – PPO | Admitting: Vascular Surgery

## 2020-09-07 ENCOUNTER — Encounter (HOSPITAL_COMMUNITY)
Admission: RE | Disposition: A | Discharge status: Home / Self Care | Payer: Self-pay | Source: Home / Self Care | Attending: Surgery

## 2020-09-07 ENCOUNTER — Other Ambulatory Visit: Payer: Self-pay

## 2020-09-07 ENCOUNTER — Encounter (HOSPITAL_COMMUNITY): Payer: Self-pay | Admitting: Surgery

## 2020-09-07 DIAGNOSIS — M8580 Other specified disorders of bone density and structure, unspecified site: Secondary | ICD-10-CM | POA: Diagnosis not present

## 2020-09-07 DIAGNOSIS — K8010 Calculus of gallbladder with chronic cholecystitis without obstruction: Secondary | ICD-10-CM | POA: Insufficient documentation

## 2020-09-07 DIAGNOSIS — I10 Essential (primary) hypertension: Secondary | ICD-10-CM | POA: Diagnosis not present

## 2020-09-07 DIAGNOSIS — K219 Gastro-esophageal reflux disease without esophagitis: Secondary | ICD-10-CM | POA: Diagnosis not present

## 2020-09-07 DIAGNOSIS — F17210 Nicotine dependence, cigarettes, uncomplicated: Secondary | ICD-10-CM | POA: Diagnosis not present

## 2020-09-07 DIAGNOSIS — K8020 Calculus of gallbladder without cholecystitis without obstruction: Secondary | ICD-10-CM | POA: Diagnosis not present

## 2020-09-07 HISTORY — PX: CHOLECYSTECTOMY: SHX55

## 2020-09-07 SURGERY — LAPAROSCOPIC CHOLECYSTECTOMY
Anesthesia: General

## 2020-09-07 MED ORDER — CHLORHEXIDINE GLUCONATE CLOTH 2 % EX PADS: 6.0000 | MEDICATED_PAD | Freq: Once | CUTANEOUS | Status: DC

## 2020-09-07 MED ORDER — OXYCODONE HCL 5 MG PO TABS: 5.0000 mg | ORAL_TABLET | Freq: Four times a day (QID) | ORAL | 0 refills | Status: DC | PRN

## 2020-09-07 MED ORDER — LIDOCAINE 2% (20 MG/ML) 5 ML SYRINGE
INTRAMUSCULAR | Status: AC
  Filled 2020-09-07: qty 5

## 2020-09-07 MED ORDER — OXYCODONE HCL 5 MG PO TABS
5.0000 mg | ORAL_TABLET | Freq: Four times a day (QID) | ORAL | Status: DC | PRN
  Administered 2020-09-07: 5 mg via ORAL

## 2020-09-07 MED ORDER — ROCURONIUM BROMIDE 10 MG/ML (PF) SYRINGE
PREFILLED_SYRINGE | INTRAVENOUS | Status: AC
  Filled 2020-09-07: qty 10

## 2020-09-07 MED ORDER — ONDANSETRON HCL 4 MG/2ML IJ SOLN
INTRAMUSCULAR | Status: DC | PRN
  Administered 2020-09-07: 4 mg via INTRAVENOUS

## 2020-09-07 MED ORDER — BUPIVACAINE HCL 0.25 % IJ SOLN
INTRAMUSCULAR | Status: DC | PRN
  Administered 2020-09-07: 15 mL

## 2020-09-07 MED ORDER — ORAL CARE MOUTH RINSE: 15.0000 mL | Freq: Once | OROMUCOSAL | Status: AC

## 2020-09-07 MED ORDER — PROPOFOL 10 MG/ML IV BOLUS
INTRAVENOUS | Status: AC
  Filled 2020-09-07: qty 20

## 2020-09-07 MED ORDER — HYDROMORPHONE HCL 1 MG/ML IJ SOLN
0.2500 mg | INTRAMUSCULAR | Status: DC | PRN
  Administered 2020-09-07 (×2): 0.25 mg via INTRAVENOUS

## 2020-09-07 MED ORDER — EPHEDRINE 5 MG/ML INJ
INTRAVENOUS | Status: AC
  Filled 2020-09-07: qty 10

## 2020-09-07 MED ORDER — SUGAMMADEX SODIUM 200 MG/2ML IV SOLN
INTRAVENOUS | Status: DC | PRN
  Administered 2020-09-07: 200 mg via INTRAVENOUS

## 2020-09-07 MED ORDER — PROPOFOL 10 MG/ML IV BOLUS
INTRAVENOUS | Status: DC | PRN
  Administered 2020-09-07: 110 mg via INTRAVENOUS

## 2020-09-07 MED ORDER — ONDANSETRON HCL 4 MG/2ML IJ SOLN
INTRAMUSCULAR | Status: AC
  Filled 2020-09-07: qty 2

## 2020-09-07 MED ORDER — MIDAZOLAM HCL 5 MG/5ML IJ SOLN
INTRAMUSCULAR | Status: DC | PRN
  Administered 2020-09-07: 2 mg via INTRAVENOUS

## 2020-09-07 MED ORDER — FENTANYL CITRATE (PF) 250 MCG/5ML IJ SOLN
INTRAMUSCULAR | Status: AC
  Filled 2020-09-07: qty 5

## 2020-09-07 MED ORDER — OXYCODONE HCL 5 MG PO TABS
ORAL_TABLET | ORAL | Status: AC
  Filled 2020-09-07: qty 1

## 2020-09-07 MED ORDER — LACTATED RINGERS IV SOLN: INTRAVENOUS | Status: DC

## 2020-09-07 MED ORDER — PROMETHAZINE HCL 25 MG/ML IJ SOLN: 6.2500 mg | INTRAMUSCULAR | Status: DC | PRN

## 2020-09-07 MED ORDER — CEFAZOLIN SODIUM-DEXTROSE 2-4 GM/100ML-% IV SOLN
2.0000 g | INTRAVENOUS | Status: AC
  Administered 2020-09-07: 2 g via INTRAVENOUS
  Filled 2020-09-07: qty 100

## 2020-09-07 MED ORDER — ROCURONIUM BROMIDE 10 MG/ML (PF) SYRINGE
PREFILLED_SYRINGE | INTRAVENOUS | Status: DC | PRN
  Administered 2020-09-07: 40 mg via INTRAVENOUS

## 2020-09-07 MED ORDER — OXYCODONE HCL 5 MG PO TABS
5.0000 mg | ORAL_TABLET | Freq: Four times a day (QID) | ORAL | 0 refills | Status: DC | PRN
Start: 2020-09-07 — End: 2020-11-02

## 2020-09-07 MED ORDER — PHENYLEPHRINE HCL-NACL 10-0.9 MG/250ML-% IV SOLN
INTRAVENOUS | Status: DC | PRN
  Administered 2020-09-07: 50 ug/min via INTRAVENOUS

## 2020-09-07 MED ORDER — MIDAZOLAM HCL 2 MG/2ML IJ SOLN
INTRAMUSCULAR | Status: AC
  Filled 2020-09-07: qty 2

## 2020-09-07 MED ORDER — MEPERIDINE HCL 25 MG/ML IJ SOLN: 6.2500 mg | INTRAMUSCULAR | Status: DC | PRN

## 2020-09-07 MED ORDER — DOCUSATE SODIUM 100 MG PO CAPS: 100.0000 mg | ORAL_CAPSULE | Freq: Two times a day (BID) | ORAL | 2 refills | Status: DC

## 2020-09-07 MED ORDER — FENTANYL CITRATE (PF) 250 MCG/5ML IJ SOLN
INTRAMUSCULAR | Status: DC | PRN
  Administered 2020-09-07: 100 ug via INTRAVENOUS
  Administered 2020-09-07: 50 ug via INTRAVENOUS

## 2020-09-07 MED ORDER — CHLORHEXIDINE GLUCONATE 0.12 % MT SOLN
15.0000 mL | Freq: Once | OROMUCOSAL | Status: AC
  Administered 2020-09-07: 15 mL via OROMUCOSAL
  Filled 2020-09-07: qty 15

## 2020-09-07 MED ORDER — IBUPROFEN 800 MG PO TABS: 800.0000 mg | ORAL_TABLET | Freq: Three times a day (TID) | ORAL | 1 refills | Status: DC | PRN

## 2020-09-07 MED ORDER — ACETAMINOPHEN 500 MG PO TABS
1000.0000 mg | ORAL_TABLET | ORAL | Status: AC
  Administered 2020-09-07: 1000 mg via ORAL
  Filled 2020-09-07: qty 2

## 2020-09-07 MED ORDER — DEXAMETHASONE SODIUM PHOSPHATE 10 MG/ML IJ SOLN
INTRAMUSCULAR | Status: AC
  Filled 2020-09-07: qty 1

## 2020-09-07 MED ORDER — EPHEDRINE SULFATE-NACL 50-0.9 MG/10ML-% IV SOSY
PREFILLED_SYRINGE | INTRAVENOUS | Status: DC | PRN
  Administered 2020-09-07 (×2): 10 mg via INTRAVENOUS

## 2020-09-07 MED ORDER — HYDROMORPHONE HCL 1 MG/ML IJ SOLN
INTRAMUSCULAR | Status: AC
  Filled 2020-09-07: qty 1

## 2020-09-07 MED ORDER — LIDOCAINE 2% (20 MG/ML) 5 ML SYRINGE
INTRAMUSCULAR | Status: DC | PRN
  Administered 2020-09-07: 100 mg via INTRAVENOUS

## 2020-09-07 MED ORDER — APREPITANT 40 MG PO CAPS
40.0000 mg | ORAL_CAPSULE | Freq: Once | ORAL | Status: DC
  Filled 2020-09-07: qty 1

## 2020-09-07 MED ORDER — SODIUM CHLORIDE 0.9 % IR SOLN
Status: DC | PRN
Start: 2020-09-07 — End: 2020-09-07
  Administered 2020-09-07: 1000 mL

## 2020-09-07 MED ORDER — BUPIVACAINE HCL (PF) 0.25 % IJ SOLN
INTRAMUSCULAR | Status: AC
  Filled 2020-09-07: qty 30

## 2020-09-07 MED ORDER — DEXAMETHASONE SODIUM PHOSPHATE 10 MG/ML IJ SOLN
INTRAMUSCULAR | Status: DC | PRN
  Administered 2020-09-07: 10 mg via INTRAVENOUS

## 2020-09-07 SURGICAL SUPPLY — 43 items
ADH SKN CLS APL DERMABOND .7 (GAUZE/BANDAGES/DRESSINGS) ×1
APL PRP STRL LF DISP 70% ISPRP (MISCELLANEOUS) ×1
APPLIER CLIP 5 13 M/L LIGAMAX5 (MISCELLANEOUS) ×2
APR CLP MED LRG 5 ANG JAW (MISCELLANEOUS) ×1
BAG SPEC RTRVL LRG 6X4 10 (ENDOMECHANICALS) ×1
BLADE CLIPPER SURG (BLADE) IMPLANT
CANISTER SUCT 3000ML PPV (MISCELLANEOUS) ×2 IMPLANT
CHLORAPREP W/TINT 26 (MISCELLANEOUS) ×2 IMPLANT
CLIP APPLIE 5 13 M/L LIGAMAX5 (MISCELLANEOUS) ×1 IMPLANT
COVER SURGICAL LIGHT HANDLE (MISCELLANEOUS) ×2 IMPLANT
DERMABOND ADVANCED (GAUZE/BANDAGES/DRESSINGS) ×1
DERMABOND ADVANCED .7 DNX12 (GAUZE/BANDAGES/DRESSINGS) ×1 IMPLANT
DISSECTOR BLUNT TIP ENDO 5MM (MISCELLANEOUS) IMPLANT
ELECT CAUTERY BLADE 6.4 (BLADE) ×2 IMPLANT
ELECT REM PT RETURN 9FT ADLT (ELECTROSURGICAL) ×2
ELECTRODE REM PT RTRN 9FT ADLT (ELECTROSURGICAL) ×1 IMPLANT
GLOVE BIO SURGEON STRL SZ 6.5 (GLOVE) ×2 IMPLANT
GLOVE BIOGEL PI IND STRL 6 (GLOVE) ×1 IMPLANT
GLOVE BIOGEL PI INDICATOR 6 (GLOVE) ×1
GOWN STRL REUS W/ TWL LRG LVL3 (GOWN DISPOSABLE) ×3 IMPLANT
GOWN STRL REUS W/TWL LRG LVL3 (GOWN DISPOSABLE) ×6
KIT BASIN OR (CUSTOM PROCEDURE TRAY) ×2 IMPLANT
KIT TURNOVER KIT B (KITS) ×2 IMPLANT
NS IRRIG 1000ML POUR BTL (IV SOLUTION) ×2 IMPLANT
PAD ARMBOARD 7.5X6 YLW CONV (MISCELLANEOUS) ×2 IMPLANT
PENCIL BUTTON HOLSTER BLD 10FT (ELECTRODE) ×2 IMPLANT
POUCH SPECIMEN RETRIEVAL 10MM (ENDOMECHANICALS) ×2 IMPLANT
SCISSORS LAP 5X35 DISP (ENDOMECHANICALS) ×2 IMPLANT
SET IRRIG TUBING LAPAROSCOPIC (IRRIGATION / IRRIGATOR) ×1 IMPLANT
SET TUBE SMOKE EVAC HIGH FLOW (TUBING) ×2 IMPLANT
SLEEVE ENDOPATH XCEL 5M (ENDOMECHANICALS) ×4 IMPLANT
SUT MNCRL AB 4-0 PS2 18 (SUTURE) ×2 IMPLANT
SUT VIC AB 0 CT1 27 (SUTURE)
SUT VIC AB 0 CT1 27XBRD ANBCTR (SUTURE) IMPLANT
SUT VIC AB 0 UR5 27 (SUTURE) IMPLANT
SUT VICRYL 0 AB UR-6 (SUTURE) ×1 IMPLANT
SYR CONTROL 10ML LL (SYRINGE) ×1 IMPLANT
SYSTEM CARTER THOMASON II (TROCAR) IMPLANT
TOWEL GREEN STERILE FF (TOWEL DISPOSABLE) ×2 IMPLANT
TRAY LAPAROSCOPIC MC (CUSTOM PROCEDURE TRAY) ×2 IMPLANT
TROCAR XCEL BLUNT TIP 100MML (ENDOMECHANICALS) ×2 IMPLANT
TROCAR XCEL NON-BLD 5MMX100MML (ENDOMECHANICALS) ×2 IMPLANT
WATER STERILE IRR 1000ML POUR (IV SOLUTION) ×2 IMPLANT

## 2020-09-07 NOTE — Op Note (Signed)
   Operative Note  Date: 09/07/2020  Procedure: laparoscopic cholecystectomy  Pre-op diagnosis: symptomatic cholelithiasis Post-op diagnosis: same  Indication and clinical history: The patient is a 65 y.o. year old female with symptomatic cholelithiasis.  Surgeon: [name], MD Assistant: Dema Severin, MD  Anesthesiologist: Lissa Hoard, MD Anesthesia: General  Findings:  . Specimen: gallbladder . EBL: <5cc . Drains/Implants: none  Disposition: PACU - hemodynamically stable.  Description of procedure: The patient was positioned supine on the operating room table. Time-out was performed verifying correct patient, procedure, signature of informed consent, and administration of pre-operative antibiotics. General anesthetic induction and intubation were uneventful. The abdomen was prepped and draped in the usual sterile fashion. An infra-umbilical incision was made using an open technique using zero vicryl stay sutures on either side of the fascia and a 24mm Hassan port inserted. After establishing pneumoperitoneum, which the patient tolerated well, the abdominal cavity was inspected and no injury of any intra-abdominal structures was identified. Additional ports were placed under direct visualization and using local anesthetic: two 40mm ports in the right subcostal region and a 88mm port in the epigastric region. The patient was re-positioned to reverse Trendelenburg and right side up. Adhesiolysis was performed to expose the gallbladder, which was then retracted cephalad. The infundibulum was identified and retracted toward the right lower quadrant. The peritoneum was incised over the infundibulum and the triangle of Calot dissected to expose the critical view of safety. With clear identification and isolation of the cystic duct and cystic artery, the cystic artery was doubly clipped and divided. After this, the cystic duct was identified as a single structure entering the gallbladder, and was also  doubly clipped and divided. The gallbladder was dissected off the liver bed using electrocautery and hemostasis of the liver bed was confirmed prior to separation of the final peritoneal attachments of the gallbladder to the liver bed. There was minimal spillage of bile during dissection. The gallbladder fossa was irrigated and fluid returned clear. After transection of the final peritoneal attachments, the gallbladder was placed in an endoscopic specimen retrieval bag, removed via the umbilical port site, and sent to pathology as a specimen. The gallbladder fossa was inspected confirming hemostasis, the absence of bile leakage from the cystic duct stump, and correct placement of clips on the cystic artery and cystic duct stumps. The abdomen was desufflated and the fascia of the umbilical port site was closed using the previously placed stay sutures. Additional local anesthetic was administered at the umbilical port site.  The skin of all incisions was closed with 4-0 monocryl. Sterile dressings were applied. All sponge and instrument counts were correct at the conclusion of the procedure. The patient was awakened from anesthesia, extubated uneventfully, and transported to the PACU - hemodynamically stable.. There were no complications.    [name], MD General and [facility]

## 2020-09-07 NOTE — Transfer of Care (Signed)
Immediate Anesthesia Transfer of Care Note  Patient: Joy Olson  Procedure(s) Performed: LAPAROSCOPIC CHOLECYSTECTOMY (N/A )  Patient Location: PACU  Anesthesia Type:General  Level of Consciousness: awake, alert  and oriented  Airway & Oxygen Therapy: Patient Spontanous Breathing  Post-op Assessment: Report given to RN, Post -op Vital signs reviewed and stable and Patient moving all extremities X 4  Post vital signs: Reviewed and stable  Last Vitals:  Vitals Value Taken Time  BP 134/67 09/07/20 0843  Temp 36.5 C 09/07/20 0843  Pulse 52 09/07/20 0846  Resp 10 09/07/20 0846  SpO2 98 % 09/07/20 0846  Vitals shown include unvalidated device data.  Last Pain:  Vitals:   09/07/20 0843  PainSc: 7       Patients Stated Pain Goal: 2 (99/35/70 1779)  Complications: No complications documented.

## 2020-09-07 NOTE — Discharge Instructions (Signed)
May shower beginning 09/08/2020. Do not peel off or scrub skin glue. May allow warm soapy water to run over incision, then rinse and pat dry. Do not soak in any water (tubs, hot tubs, pools, lakes, oceans) for one week.   No lifting greater than 5 pounds for six weeks.   Pain regimen: take over-the-counter tylenol (acetaminophen) 1000mg  every six hours and the prescription ibuprofen (800mg ) every eight hours. You also have a prescription for oxycodone, which should be taken if the tylenol (acetaminophen) and ibuprofen are not enough to control your pain. You may take the oxycodone as frequently as every six hours as needed. You have also been given a prescription for colace (docusate) which is a stool softener. Please take this as prescribed because the oxycodone can cause constipation and the colace (docusate) will minimize or prevent constipation.  Call the office at [phone] for temperature greater than 101.56F, worsening pain, redness or warmth at the incision site.  Please call [phone] to make an appointment for 1 week after surgery for wound check.

## 2020-09-07 NOTE — H&P (Signed)
   Joy Olson is an 65 y.o. female.   HPI: 89F with symptomatic cholelithiasis here today for lap chole. The patient has had no hospitalizations, ER visits, surgeries, or newly diagnosed allergies since being seen in the office. Seen by PCP for regularly scheduled visit, noted to have transaminitis on labs drawn at that time, normal Tbili. Had an attack 12/28 that was pretty severe, but otherwise, low level symptoms.    Past Medical History:  Diagnosis Date  . Borderline diabetes 07/26/2016  . Hypertension   . Osteopenia 02/02/2016    Past Surgical History:  Procedure Laterality Date  . TUBAL LIGATION  1988    Family History  Problem Relation Age of Onset  . Heart disease Mother        hx chf, died at age15  . Heart disease Maternal Grandmother   . Heart attack Maternal Grandfather   . Colon cancer Neg Hx     Social History:  reports that she has been smoking cigarettes. She has a 20.00 pack-year smoking history. She has never used smokeless tobacco. She reports that she does not drink alcohol and does not use drugs.  Allergies: No Known Allergies  Medications: I have reviewed the patient's current medications.  No results found for this or any previous visit (from the past 48 hour(s)).  No results found.  ROS 10 point review of systems is negative except as listed above in HPI.   Physical Exam Blood pressure 135/67, pulse (!) 57, temperature 97.8 F (36.6 C), resp. rate 18, height 5\' 1"  (1.549 m), weight 53.3 kg, last menstrual period 08/15/2009, SpO2 97 %. Constitutional: well-developed, well-nourished HEENT: pupils equal, round, reactive to light, 42mm b/l, moist conjunctiva, external inspection of ears and nose normal, hearing intact Oropharynx: normal oropharyngeal mucosa, normal dentition Neck: no thyromegaly, trachea midline, no midline cervical tenderness to palpation Chest: breath sounds equal bilaterally, normal respiratory effort, no midline or lateral  chest wall tenderness to palpation/deformity Abdomen: soft, NT, no bruising, no hepatosplenomegaly GU: normal female genitalia  Back: no wounds, no thoracic/lumbar spine tenderness to palpation, no thoracic/lumbar spine stepoffs Rectal: deferred Extremities: 2+ radial and pedal pulses bilaterally, motor and sensation intact to bilateral UE and LE, no peripheral edema MSK: normal gait/station, no clubbing/cyanosis of fingers/toes, normal ROM of all four extremities Skin: warm, dry, no rashes Psych: normal memory, normal mood/affect    Assessment/Plan: 29F with symptomatic cholelithiasis, here today for lap chole. Informed consent was obtained after detailed explanation of risks, including bleeding, infection, biloma, need for IOC to delineate anatomy, and need for conversion to open procedure. All questions answered to the patient's satisfaction.   [name], MD General and [facility]

## 2020-09-07 NOTE — Anesthesia Procedure Notes (Signed)
Procedure Name: Intubation Date/Time: 09/07/2020 7:33 AM Performed by: Kyung Rudd, CRNA Pre-anesthesia Checklist: Patient identified, Emergency Drugs available, Suction available and Patient being monitored Patient Re-evaluated:Patient Re-evaluated prior to induction Oxygen Delivery Method: Circle system utilized Preoxygenation: Pre-oxygenation with 100% oxygen Induction Type: IV induction Ventilation: Mask ventilation without difficulty Laryngoscope Size: Mac and 3 Grade View: Grade I Tube type: Oral Tube size: 7.0 mm Number of attempts: 1 Airway Equipment and Method: Stylet Placement Confirmation: ETT inserted through vocal cords under direct vision,  positive ETCO2 and breath sounds checked- equal and bilateral Secured at: 20 cm Tube secured with: Tape Dental Injury: Teeth and Oropharynx as per pre-operative assessment

## 2020-09-08 ENCOUNTER — Encounter (HOSPITAL_COMMUNITY): Payer: Self-pay | Admitting: Surgery

## 2020-09-08 LAB — SURGICAL PATHOLOGY

## 2020-09-08 NOTE — Anesthesia Postprocedure Evaluation (Signed)
Anesthesia Post Note  Patient: Joy Olson  Procedure(s) Performed: LAPAROSCOPIC CHOLECYSTECTOMY (N/A )     Patient location during evaluation: PACU Anesthesia Type: General Level of consciousness: sedated and patient cooperative Pain management: pain level controlled Vital Signs Assessment: post-procedure vital signs reviewed and stable Respiratory status: spontaneous breathing Cardiovascular status: stable Anesthetic complications: no   No complications documented.  Last Vitals:  Vitals:   09/07/20 0915 09/07/20 0930  BP: (!) 137/54 (!) 134/58  Pulse: (!) 45 (!) 51  Resp: 12 13  Temp:  36.5 C  SpO2: 97% 96%    Last Pain:  Vitals:   09/07/20 0915  PainSc: [place]

## 2020-09-08 NOTE — Progress Notes (Signed)
Good morning,   I performed a laparoscopic cholecystectomy on Joy Olson yesterday and it was uneventful. I will see her for follow-up in a couple of weeks and discuss her pathology when it is available. Please feel free to call/text me at [phone] with any questions/concerns.   [name], MD General and [facility]

## 2020-11-02 ENCOUNTER — Other Ambulatory Visit: Payer: Self-pay

## 2020-11-02 ENCOUNTER — Other Ambulatory Visit: Payer: Self-pay | Admitting: Family

## 2020-11-02 ENCOUNTER — Encounter: Payer: Self-pay | Admitting: Family

## 2020-11-02 ENCOUNTER — Ambulatory Visit (INDEPENDENT_AMBULATORY_CARE_PROVIDER_SITE_OTHER): Payer: BC Managed Care – PPO | Admitting: Family

## 2020-11-02 VITALS — BP 119/66 | HR 64 | Temp 98.6°F | Resp 16 | Ht 61.0 in | Wt 116.8 lb

## 2020-11-02 DIAGNOSIS — K219 Gastro-esophageal reflux disease without esophagitis: Secondary | ICD-10-CM

## 2020-11-02 DIAGNOSIS — I10 Essential (primary) hypertension: Secondary | ICD-10-CM

## 2020-11-02 DIAGNOSIS — Z0000 Encounter for general adult medical examination without abnormal findings: Secondary | ICD-10-CM

## 2020-11-02 DIAGNOSIS — R945 Abnormal results of liver function studies: Secondary | ICD-10-CM

## 2020-11-02 DIAGNOSIS — R7989 Other specified abnormal findings of blood chemistry: Secondary | ICD-10-CM

## 2020-11-02 DIAGNOSIS — M8580 Other specified disorders of bone density and structure, unspecified site: Secondary | ICD-10-CM

## 2020-11-02 LAB — COMPREHENSIVE METABOLIC PANEL
ALT: 10 U/L (ref 0–35)
AST: 13 U/L (ref 0–37)
Albumin: 4.5 g/dL (ref 3.5–5.2)
Alkaline Phosphatase: 72 U/L (ref 39–117)
BUN: 13 mg/dL (ref 6–23)
CO2: 29 mEq/L (ref 19–32)
Calcium: 9.9 mg/dL (ref 8.4–10.5)
Chloride: 100 mEq/L (ref 96–112)
Creatinine, Ser: 0.75 mg/dL (ref 0.40–1.20)
GFR: 85.21 mL/min (ref >60.00)
Glucose, Bld: 98 mg/dL (ref 70–99)
Potassium: 4.2 mEq/L (ref 3.5–5.1)
Sodium: 137 mEq/L (ref 135–145)
Total Bilirubin: 0.4 mg/dL (ref 0.2–1.2)
Total Protein: 6.6 g/dL (ref 6.0–8.3)

## 2020-11-02 MED ORDER — PANTOPRAZOLE SODIUM 40 MG PO TBEC: 40.0000 mg | DELAYED_RELEASE_TABLET | Freq: Every day | ORAL | 1 refills | Status: DC | PRN

## 2020-11-02 NOTE — Progress Notes (Signed)
Subjective:    Patient ID: Joy Olson, female    DOB: 12-08-1955, 65 y.o.   MRN: [id number]  HPI  Patient is a 64 yr old female who presents today for cpx.  Immunizations:  Pfizer x 3, declines shingrix.  Diet: healthy Exercise: walks regularly Colonoscopy:  Up to date Dexa: declines-  Pap Smear: would like to do next year Mammogram: due Vision: up to date Dental: up to date Wt Readings from Last 3 Encounters:  11/02/20 116 lb 12.8 oz (53 kg)  09/07/20 117 lb 8.1 oz (53.3 kg)  09/01/20 117 lb 8 oz (53.3 kg)   GERD- using nexium prn.  Reports symptoms stable.   HTN- maintained on losartan, amlodipine and metoprolol.   BP Readings from Last 3 Encounters:  11/02/20 119/66  09/07/20 (!) 134/58  09/01/20 140/68     Review of Systems  Constitutional: Negative for unexpected weight change.  HENT: Negative for hearing loss and rhinorrhea.   Eyes: Negative for visual disturbance.  Respiratory: Negative for cough and shortness of breath.   Cardiovascular: Negative for chest pain.  Gastrointestinal: Negative for blood in stool, diarrhea, nausea and vomiting.  Genitourinary: Negative for dysuria, frequency and hematuria.  Musculoskeletal: Negative for arthralgias and myalgias.  Skin: Negative for rash.  Neurological: Negative for headaches.  Hematological: Negative for adenopathy.  Psychiatric/Behavioral:       Denies depression/anxiety       Past Medical History:  Diagnosis Date  . Borderline diabetes 07/26/2016  . Hypertension   . Osteopenia 02/02/2016     Social History   Socioeconomic History  . Marital status: Married    Spouse name: Not on file  . Number of children: 2  . Years of education: Not on file  . Highest education level: Not on file  Occupational History  . Not on file  Tobacco Use  . Smoking status: Current Every Day Smoker    Packs/day: 0.50    Years: 40.00    Pack years: 20.00    Types: Cigarettes    Last attempt to quit: 01/27/2017     Years since quitting: 3.7  . Smokeless tobacco: Never Used  Vaping Use  . Vaping Use: Never used  Substance and Sexual Activity  . Alcohol use: No  . Drug use: No  . Sexual activity: Yes  Other Topics Concern  . 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 CHOLECYSTECTOMY N/A 09/07/2020   Procedure: LAPAROSCOPIC CHOLECYSTECTOMY;  Surgeon: [name], MD;  Location: [facility] OR;  Service: General;  Laterality: N/A;  . TUBAL LIGATION  1988    Family History  Problem Relation Age of Onset  . Heart disease Mother        hx chf, died at  [id number]  . Heart disease Maternal Grandmother   . Heart attack Maternal Grandfather   . Colon cancer Neg Hx     No Known Allergies  Current Outpatient Medications on File Prior to Visit  Medication Sig Dispense Refill  . amLODipine (NORVASC) 10 MG tablet Take 1 tablet (10 mg total) by mouth daily. 90 tablet 1  . esomeprazole (NEXIUM) 40 MG capsule Take 1 capsule (40 mg total) by mouth daily as needed. (Patient taking differently: Take 40 mg by mouth daily as needed (heartburn).) 90 capsule 1  . ibuprofen (ADVIL) 800 MG tablet Take 1 tablet (800 mg total) by mouth every 8 (eight) hours as needed. 90 tablet 1  . losartan-hydrochlorothiazide (HYZAAR) 100-25 MG tablet Take 1 tablet by mouth daily. 90 tablet  1  . metoprolol succinate (TOPROL XL) 50 MG 24 hr tablet Take 1 tablet (50 mg total) by mouth daily. Take with or immediately following a meal. 90 tablet 1   Current Facility-Administered Medications on File Prior to Visit  Medication Dose Route Frequency Provider Last Rate Last Admin  . 0.9 %  sodium chloride infusion  500 mL Intravenous Continuous Danis, Estill Cotta III, MD        BP 119/66 (BP Location: Right Arm, Patient Position: Sitting, Cuff Size: Small)   Pulse 64   Temp 98.6 F (37 C) (Oral)   Resp 16   Ht 5\' 1"  (1.549 m)   Wt 116 lb 12.8 oz (53 kg)   LMP 08/15/2009   SpO2 100%   BMI 22.07 kg/m    Objective:   Physical Exam  Physical Exam  Constitutional: She is oriented to person, place, and time. She appears well-developed and well-nourished. No distress.  HENT:  Head: Normocephalic and atraumatic.  Right Ear: Tympanic membrane and ear canal normal.  Left Ear: Tympanic membrane and ear canal normal.  Mouth/Throat: not examined- pt wearing mask Eyes: Pupils are equal, round, and reactive to light. No scleral icterus.  Neck: Normal range of motion. No thyromegaly present.  Cardiovascular: Normal rate and regular rhythm.   No murmur heard. Pulmonary/Chest: Effort normal and breath sounds normal. No respiratory distress. He has no wheezes. She has no rales. She exhibits no tenderness.  Abdominal: Soft. Bowel sounds are normal. She exhibits no distension and no mass. There is no tenderness. There is no rebound and no guarding.  Musculoskeletal: She exhibits no edema.  Lymphadenopathy:    She has no cervical adenopathy.  Neurological: She is alert and oriented to person, place, and time. She has normal patellar reflexes. She exhibits normal muscle tone. Coordination normal.  Skin: Skin is warm and dry.  Psychiatric: She has a normal mood and affect. Her behavior is normal. Judgment and thought content normal.  Breasts: Examined lying Right: Without masses, retractions,  discharge or axillary adenopathy.  Left: Without masses, retractions, discharge or axillary adenopathy.          Assessment & Plan:  Preventative care- encouraged smoking cessation. Continue healthy diet and regular exercise.  Refer for mammo/bone density. Colo up to date. Declines pap this year.  Labs as ordered.  GERD- stable on prn nexium. Pt states nexium is now non-formulary with insurance.  Will change to protonix 40mg  daily prn.  HTN- BP stable. Continue hyzaar 100-25mg , toprol xl 50mg , and amlodipine 10mg .  This visit occurred during the SARS-CoV-2 public health emergency.  Safety protocols were in place, including screening questions prior to the visit, additional usage of staff PPE, and extensive  cleaning of exam room while observing appropriate contact time as indicated for disinfecting solutions.           Assessment & Plan:

## 2020-11-02 NOTE — Patient Instructions (Addendum)
Please complete lab work prior to leaving.  Please work on quitting smoking.

## 2020-11-24 MED FILL — Ibuprofen Tab 800 MG: ORAL | 30 days supply | Qty: 90 | Fill #0 | Status: AC

## 2020-11-25 ENCOUNTER — Other Ambulatory Visit (HOSPITAL_BASED_OUTPATIENT_CLINIC_OR_DEPARTMENT_OTHER): Payer: Self-pay

## 2020-12-24 ENCOUNTER — Other Ambulatory Visit: Payer: Self-pay

## 2020-12-24 ENCOUNTER — Encounter (HOSPITAL_BASED_OUTPATIENT_CLINIC_OR_DEPARTMENT_OTHER): Payer: Self-pay

## 2020-12-24 ENCOUNTER — Ambulatory Visit (HOSPITAL_BASED_OUTPATIENT_CLINIC_OR_DEPARTMENT_OTHER)
Admission: RE | Admit: 2020-12-24 | Discharge: 2020-12-24 | Disposition: A | Discharge status: Home / Self Care | Payer: BC Managed Care – PPO | Source: Ambulatory Visit | Attending: Family | Admitting: Family

## 2020-12-24 DIAGNOSIS — Z0000 Encounter for general adult medical examination without abnormal findings: Secondary | ICD-10-CM | POA: Insufficient documentation

## 2020-12-24 DIAGNOSIS — M8580 Other specified disorders of bone density and structure, unspecified site: Secondary | ICD-10-CM | POA: Diagnosis not present

## 2020-12-24 DIAGNOSIS — Z780 Asymptomatic menopausal state: Secondary | ICD-10-CM | POA: Diagnosis not present

## 2020-12-24 DIAGNOSIS — Z1231 Encounter for screening mammogram for malignant neoplasm of breast: Secondary | ICD-10-CM | POA: Diagnosis not present

## 2020-12-24 DIAGNOSIS — M810 Age-related osteoporosis without current pathological fracture: Secondary | ICD-10-CM | POA: Diagnosis not present

## 2020-12-28 ENCOUNTER — Other Ambulatory Visit: Payer: Self-pay | Admitting: Family

## 2020-12-28 NOTE — Telephone Encounter (Signed)
Please advise pt that her bone density has worsened and is showing osteoporosis. I would recommend that she start fosamax 70mg  once weekly for bone health. She should take fosamax in AM and sit upright for 90 minutes after taking.Be sure to take calcium in the form of of caltrate 600mg  + D one tablet by mouth twice daily and please ensure regular weight bearing exercise such as walking and work on quitting smoking.

## 2020-12-28 NOTE — Telephone Encounter (Signed)
Patient advised of result and provider's advise in detail. She will like to read about the medication (fosamax) and get back to [place] about taking it or not. Message from provider forwarded to patient on MyChart with all the details.

## 2020-12-29 ENCOUNTER — Telehealth: Payer: Self-pay

## 2020-12-29 NOTE — Telephone Encounter (Signed)
Patient advised of result and provider's advise in detail. She will like to read about the medication (fosamax) and get back to [place] about taking it or not. Message from provider forwarded to patient on MyChart with all the details.       Documentation    Debbrah Alar, NP routed conversation to You Yesterday (2:08 PM)   Debbrah Alar, NP Yesterday (2:08 PM)       Please advise pt that her bone density has worsened and is showing osteoporosis. I would recommend that she start fosamax 70mg  once weekly for bone health. She should take fosamax in AM and sit upright for 90 minutes after taking.Be sure to take calcium in the form of of caltrate 600mg  + D one tablet by mouth twice daily and please ensure regular weight bearing exercise such as walking and work on quitting smoking.

## 2021-03-31 ENCOUNTER — Other Ambulatory Visit: Payer: Self-pay | Admitting: Family

## 2021-04-01 ENCOUNTER — Other Ambulatory Visit (HOSPITAL_BASED_OUTPATIENT_CLINIC_OR_DEPARTMENT_OTHER): Payer: Self-pay

## 2021-04-01 MED ORDER — METOPROLOL SUCCINATE ER 50 MG PO TB24
ORAL_TABLET | Freq: Every day | ORAL | 1 refills | Status: DC
  Filled 2021-04-01: qty 90, 90d supply, fill #0
  Filled 2021-09-28: qty 90, 90d supply, fill #1

## 2021-04-01 MED ORDER — AMLODIPINE BESYLATE 10 MG PO TABS
ORAL_TABLET | Freq: Every day | ORAL | 1 refills | Status: DC
  Filled 2021-04-01: qty 90, 90d supply, fill #0
  Filled 2021-09-28: qty 90, 90d supply, fill #1

## 2021-04-01 MED ORDER — LOSARTAN POTASSIUM-HCTZ 100-25 MG PO TABS
1.0000 | ORAL_TABLET | Freq: Every day | ORAL | 1 refills | Status: DC
  Filled 2021-04-01: qty 90, 90d supply, fill #0
  Filled 2021-09-28: qty 90, 90d supply, fill #1

## 2021-05-06 DIAGNOSIS — H2513 Age-related nuclear cataract, bilateral: Secondary | ICD-10-CM | POA: Diagnosis not present

## 2021-05-06 DIAGNOSIS — H5213 Myopia, bilateral: Secondary | ICD-10-CM | POA: Diagnosis not present

## 2021-05-07 ENCOUNTER — Other Ambulatory Visit: Payer: Self-pay

## 2021-05-07 ENCOUNTER — Other Ambulatory Visit (HOSPITAL_BASED_OUTPATIENT_CLINIC_OR_DEPARTMENT_OTHER): Payer: Self-pay

## 2021-05-07 ENCOUNTER — Ambulatory Visit (INDEPENDENT_AMBULATORY_CARE_PROVIDER_SITE_OTHER): Payer: BC Managed Care – PPO | Admitting: Family

## 2021-05-07 VITALS — BP 141/66 | HR 64 | Temp 98.7°F | Resp 16 | Wt 119.0 lb

## 2021-05-07 DIAGNOSIS — I10 Essential (primary) hypertension: Secondary | ICD-10-CM | POA: Diagnosis not present

## 2021-05-07 DIAGNOSIS — M5431 Sciatica, right side: Secondary | ICD-10-CM | POA: Insufficient documentation

## 2021-05-07 DIAGNOSIS — R7303 Prediabetes: Secondary | ICD-10-CM | POA: Diagnosis not present

## 2021-05-07 DIAGNOSIS — Z23 Encounter for immunization: Secondary | ICD-10-CM

## 2021-05-07 DIAGNOSIS — Z1211 Encounter for screening for malignant neoplasm of colon: Secondary | ICD-10-CM | POA: Diagnosis not present

## 2021-05-07 DIAGNOSIS — K219 Gastro-esophageal reflux disease without esophagitis: Secondary | ICD-10-CM

## 2021-05-07 LAB — COMPREHENSIVE METABOLIC PANEL
ALT: 10 U/L (ref 0–35)
AST: 15 U/L (ref 0–37)
Albumin: 4.5 g/dL (ref 3.5–5.2)
Alkaline Phosphatase: 86 U/L (ref 39–117)
BUN: 13 mg/dL (ref 6–23)
CO2: 29 mEq/L (ref 19–32)
Calcium: 10.1 mg/dL (ref 8.4–10.5)
Chloride: 101 mEq/L (ref 96–112)
Creatinine, Ser: 0.73 mg/dL (ref 0.40–1.20)
GFR: 87.7 mL/min (ref >60.00)
Glucose, Bld: 90 mg/dL (ref 70–99)
Potassium: 4.4 mEq/L (ref 3.5–5.1)
Sodium: 137 mEq/L (ref 135–145)
Total Bilirubin: 0.5 mg/dL (ref 0.2–1.2)
Total Protein: 6.9 g/dL (ref 6.0–8.3)

## 2021-05-07 LAB — HEMOGLOBIN A1C: Hgb A1c MFr Bld: 5.7 % (ref 4.6–6.5)

## 2021-05-07 MED ORDER — MELOXICAM 7.5 MG PO TABS
7.5000 mg | ORAL_TABLET | Freq: Every day | ORAL | 0 refills | Status: DC
  Filled 2021-05-07: qty 14, 14d supply, fill #0

## 2021-05-07 NOTE — Assessment & Plan Note (Signed)
BP Readings from Last 3 Encounters:  05/07/21 (!) 141/66  11/02/20 119/66  09/07/20 (!) 134/58   BP is acceptable. Continue amlodipine 10mg , hyzaar 100-25, and toprol xl 50mg .

## 2021-05-07 NOTE — Assessment & Plan Note (Signed)
Lab Results  Component Value Date   HGBA1C 6.0 07/26/2016   Lab Results  Component Value Date   LDLCALC 140 (H) 01/24/2018   CREATININE 0.75 11/02/2020

## 2021-05-07 NOTE — Patient Instructions (Signed)
Please begin meloxicam 7.5mg  once daily.

## 2021-05-07 NOTE — Assessment & Plan Note (Signed)
Stable off of protonix. Monitor.

## 2021-05-07 NOTE — Assessment & Plan Note (Signed)
New. Trial of meloxicam 7.5mg . If symptoms do not improve would consider medrol dose pack.

## 2021-05-07 NOTE — Progress Notes (Signed)
Subjective:   By signing my name below, I, Lyric Barr-McArthur, attest that this documentation has been prepared under the direction and in the presence of Debbrah Alar, NP, 05/07/2021   Patient ID: Joy Olson, female    DOB: November 10, 1955, 65 y.o.   MRN: [id number]  Chief Complaint  Patient presents with   Hypertension    Here for follow up    HPI Patient is in today for a comprehensive physical exam.   Blood pressure: Her blood pressure is elevated today but not enough for concern to change medication.  BP Readings from Last 3 Encounters:  05/07/21 (!) 141/66  11/02/20 119/66  09/07/20 (!) 134/58    Pain in upper leg: She notes a new pain present in her gluteus maximus and leading down her right leg. She has called her husbands neurosurgeon to receive advice and has been told it could be a sciatic issue. She is interested in getting pain management or muscle relaxer medication to alleviate these symptoms.  Medications: She is compliant with taking her 10 mg amlodipine, 100-25 mg losartan and 50 mg metoprolol.  Diabetes: She will get her A1C checked today due to it being a while since it was monitored.  Colonoscopy: She is planning to get an updated colonoscopy at a later date when it works with her and her husbands work schedules.  Pap Smear: She will get an updated pap smear during her next CPE in march.  Immunizations: She received her flu shot today in the office. She is interested in receiving the updated Covid-19 booster shot.    Health Maintenance Due  Topic Date Due   Zoster Vaccines- Shingrix (1 of 2) Never done   COVID-19 Vaccine (4 - Booster for Pfizer series) 10/18/2020   PAP SMEAR-Modifier  01/24/2021   INFLUENZA VACCINE  03/15/2021   COLONOSCOPY (Pts 45-51yr Insurance coverage will need to be confirmed)  03/25/2021    Past Medical History:  Diagnosis Date   Borderline diabetes 07/26/2016   Hypertension    Osteopenia 02/02/2016    Past Surgical  History:  Procedure Laterality Date   CHOLECYSTECTOMY N/A 09/07/2020   Procedure: LAPAROSCOPIC CHOLECYSTECTOMY;  Surgeon: LJesusita Oka MD;  Location: [facility] OR;  Service: General;  Laterality: N/A;   TUBAL LIGATION  1988    Family History  Problem Relation Age of Onset   Heart disease Mother        hx chf, died at age64   Heart disease Maternal Grandmother    Heart attack Maternal Grandfather    Colon cancer Neg Hx     Social History   Socioeconomic History   Marital status: Married    Spouse name: Not on file   Number of children: 2   Years of education: Not on file   Highest education level: Not on file  Occupational History   Not on file  Tobacco Use   Smoking status: Every Day    Packs/day: 0.50    Years: 40.00    Pack years: 20.00    Types: Cigarettes    Last attempt to quit: 01/27/2017    Years since quitting: 4.2   Smokeless tobacco: Never   Tobacco comments:    10-12 cigarettes a day  Vaping Use   Vaping Use: Never used  Substance and Sexual Activity   Alcohol use: No   Drug use: No   Sexual activity: Yes  Other Topics Concern   Not on file  Social History Narrative   Son born  1985 lives in [place] live in [place]   3 grandchildren (2 boys and girl)   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 Worked there for 71 yrs   Married 20 years- husband is Office manager   Enjoys working in the yard (gypsy garden) reading, watching movies   [place] up in [place] Strain: Not on [employer] Insecurity: Not on file  Transportation Needs: Not on file  Physical Activity: Not on file  Stress: Not on file  Social Connections: Not on file  Intimate Partner Violence: Not on file    Outpatient Medications Prior to Visit  Medication Sig Dispense Refill   amLODipine (NORVASC) 10 MG tablet TAKE 1 TABLET (10 MG TOTAL) BY MOUTH DAILY. 90 tablet 1   ibuprofen (ADVIL) 800 MG  tablet TAKE 1 TABLET (800 MG TOTAL) BY MOUTH EVERY 8 (EIGHT) HOURS AS NEEDED. 90 tablet 1   losartan-hydrochlorothiazide (HYZAAR) 100-25 MG tablet TAKE 1 TABLET BY MOUTH DAILY. 90 tablet 1   metoprolol succinate (TOPROL-XL) 50 MG 24 hr tablet TAKE 1 TABLET (50 MG TOTAL) BY MOUTH DAILY. TAKE WITH OR IMMEDIATELY FOLLOWING A MEAL. 90 tablet 1   pantoprazole (PROTONIX) 40 MG tablet TAKE 1 TABLET BY MOUTH ONCE DAILY AS NEEDED 90 tablet 1   Facility-Administered Medications Prior to Visit  Medication Dose Route Frequency Provider Last Rate Last Admin   0.9 %  sodium chloride infusion  500 mL Intravenous Continuous Danis, Estill Cotta III, MD        No Known Allergies  ROS     Objective:    Physical Exam Constitutional:      General: She is not in acute distress.    Appearance: Normal appearance. She is not ill-appearing.  HENT:     Head: Normocephalic and atraumatic.     Right Ear: External ear normal.     Left Ear: External ear normal.  Eyes:     Extraocular Movements: Extraocular movements intact.     Pupils: Pupils are equal, round, and reactive to light.  Cardiovascular:     Rate and Rhythm: Normal rate and regular rhythm.     Heart sounds: Normal heart sounds. No murmur heard.   No gallop.  Pulmonary:     Effort: Pulmonary effort is normal. No respiratory distress.     Breath sounds: Normal breath sounds. No wheezing or rales.  Skin:    General: Skin is warm and dry.  Neurological:     Mental Status: She is alert and oriented to person, place, and time.  Psychiatric:        Behavior: Behavior normal.        Judgment: Judgment normal.    BP (!) 141/66 (BP Location: Right Arm, Patient Position: Sitting, Cuff Size: Small)   Pulse 64   Temp 98.7 F (37.1 C) (Oral)   Resp 16   Wt 119 lb (54 kg)   LMP 08/15/2009   SpO2 98%   BMI 22.48 kg/m  Wt Readings from Last 3 Encounters:  05/07/21 119 lb (54 kg)  11/02/20 116 lb 12.8 oz (53 kg)  09/07/20 117 lb 8.1 oz (53.3 kg)        Assessment & Plan:   Problem List Items Addressed This Visit       Unprioritized   Sciatica of right side    New. Trial of meloxicam 7.16m. If symptoms do not improve  would consider medrol dose pack.       HTN (hypertension)    BP Readings from Last 3 Encounters:  05/07/21 (!) 141/66  11/02/20 119/66  09/07/20 (!) 134/58  BP is acceptable. Continue amlodipine 69m, hyzaar 100-25, and toprol xl 571m       Relevant Orders   Comp Met (CMET)   GERD (gastroesophageal reflux disease)    Stable off of protonix. Monitor.       Borderline diabetes    Lab Results  Component Value Date   HGBA1C 6.0 07/26/2016   Lab Results  Component Value Date   LDLCALC 140 (H) 01/24/2018   CREATININE 0.75 11/02/2020        Relevant Orders   Hemoglobin A1c   Comp Met (CMET)   Other Visit Diagnoses     Needs flu shot    -  Primary   Relevant Orders   Flu Vaccine QUAD 6+ mos PF IM (Fluarix Quad PF)   Colon cancer screening       Relevant Orders   Ambulatory referral to Gastroenterology      Meds ordered this encounter  Medications   meloxicam (MOBIC) 7.5 MG tablet    Sig: Take 1 tablet (7.5 mg total) by mouth daily.    Dispense:  14 tablet    Refill:  0    Order Specific Question:   Supervising Provider    Answer:   BLPenni Homans [[id number]]    I, MeDebbrah AlarNP, personally preformed the services described in this documentation.  All medical record entries made by the scribe were at my direction and in my presence.  I have reviewed the chart and discharge instructions (if applicable) and agree that the record reflects my personal performance and is accurate and complete. 05/07/2021  I,Lyric Barr-McArthur,acting as a scribe for MeNance PearNP.,have documented all relevant documentation on the behalf of MeNance PearNP,as directed by  MeNance PearNP while in the presence of MeNance PearNP.  MeNance PearNP

## 2021-05-20 ENCOUNTER — Ambulatory Visit: Payer: Self-pay | Attending: Internal Medicine

## 2021-05-20 DIAGNOSIS — Z23 Encounter for immunization: Secondary | ICD-10-CM

## 2021-05-20 NOTE — Progress Notes (Signed)
   Covid-19 Vaccination Clinic  Name:  [name]    MRN: [id number] DOB: [birth year]  05/20/2021  [name] was observed post Covid-19 immunization for 15 minutes without incident. She was provided with Vaccine Information Sheet and instruction to access the V-Safe system.   [name] was instructed to call 911 with any severe reactions post vaccine: Difficulty breathing  Swelling of face and throat  A fast heartbeat  A bad rash all over body  Dizziness and weakness

## 2021-05-26 ENCOUNTER — Encounter: Payer: Self-pay | Admitting: Family

## 2021-05-26 ENCOUNTER — Other Ambulatory Visit (HOSPITAL_BASED_OUTPATIENT_CLINIC_OR_DEPARTMENT_OTHER): Payer: Self-pay

## 2021-05-26 MED ORDER — MELOXICAM 7.5 MG PO TABS
7.5000 mg | ORAL_TABLET | Freq: Every day | ORAL | 0 refills | Status: DC
  Filled 2021-05-26: qty 14, 14d supply, fill #0

## 2021-05-28 ENCOUNTER — Other Ambulatory Visit (HOSPITAL_BASED_OUTPATIENT_CLINIC_OR_DEPARTMENT_OTHER): Payer: Self-pay

## 2021-05-28 MED ORDER — COVID-19MRNA BIVAL VACC PFIZER 30 MCG/0.3ML IM SUSP
INTRAMUSCULAR | 0 refills | Status: DC
  Filled 2021-05-28: qty 0.3, 1d supply, fill #0

## 2021-06-02 ENCOUNTER — Ambulatory Visit: Payer: BC Managed Care – PPO

## 2021-07-12 ENCOUNTER — Encounter: Payer: Self-pay | Admitting: Family

## 2021-07-13 ENCOUNTER — Other Ambulatory Visit (HOSPITAL_BASED_OUTPATIENT_CLINIC_OR_DEPARTMENT_OTHER): Payer: Self-pay

## 2021-07-13 MED ORDER — IBUPROFEN 800 MG PO TABS
ORAL_TABLET | Freq: Three times a day (TID) | ORAL | 0 refills | Status: DC | PRN
  Filled 2021-07-13: qty 30, 10d supply, fill #0

## 2021-07-13 MED ORDER — METHYLPREDNISOLONE 4 MG PO TBPK
ORAL_TABLET | ORAL | 0 refills | Status: DC
  Filled 2021-07-13: qty 21, 6d supply, fill #0

## 2021-08-11 ENCOUNTER — Encounter: Payer: Self-pay | Admitting: Gastroenterology

## 2021-09-29 ENCOUNTER — Other Ambulatory Visit (HOSPITAL_BASED_OUTPATIENT_CLINIC_OR_DEPARTMENT_OTHER): Payer: Self-pay

## 2021-10-06 IMAGING — MG MM DIGITAL SCREENING BILAT W/ TOMO AND CAD
8 series · 9 of 24 positions shown · non-contrast
Comparison: Previous exam(s).

CLINICAL DATA: Screening.

EXAM:
DIGITAL SCREENING BILATERAL MAMMOGRAM WITH TOMOSYNTHESIS AND CAD
TECHNIQUE: Bilateral screening digital craniocaudal and mediolateral oblique
mammograms were obtained. Bilateral screening digital breast
tomosynthesis was performed. The images were evaluated with
computer-aided detection.

[L MLO synth-2D]
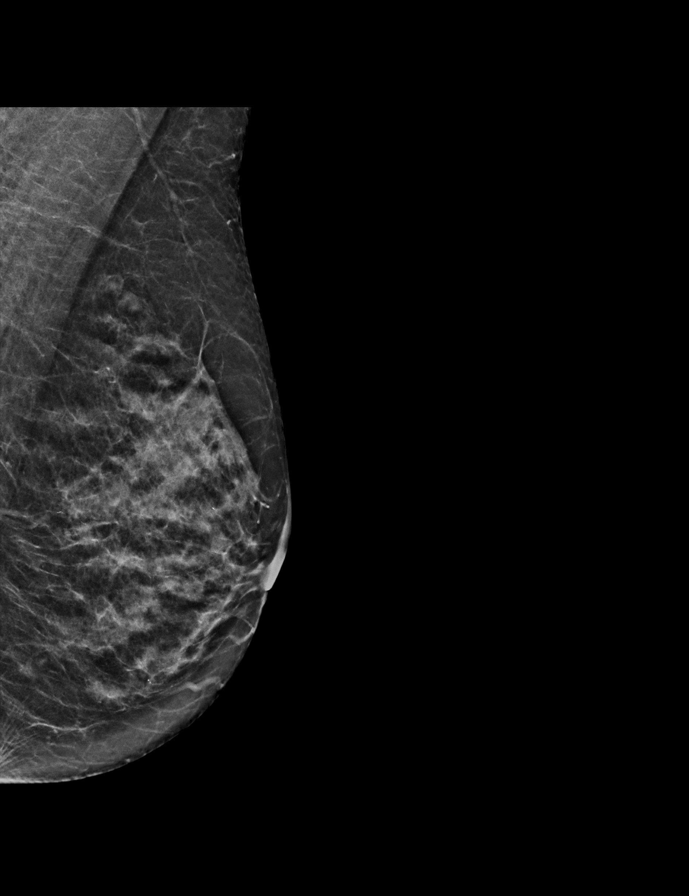

[R CC synth-2D]
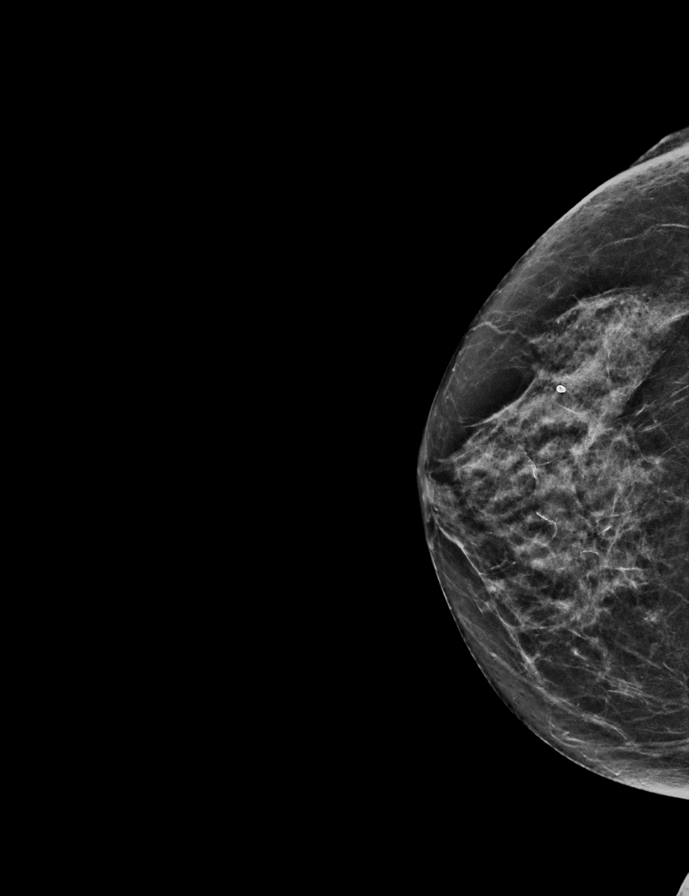

[L CC synth-2D]
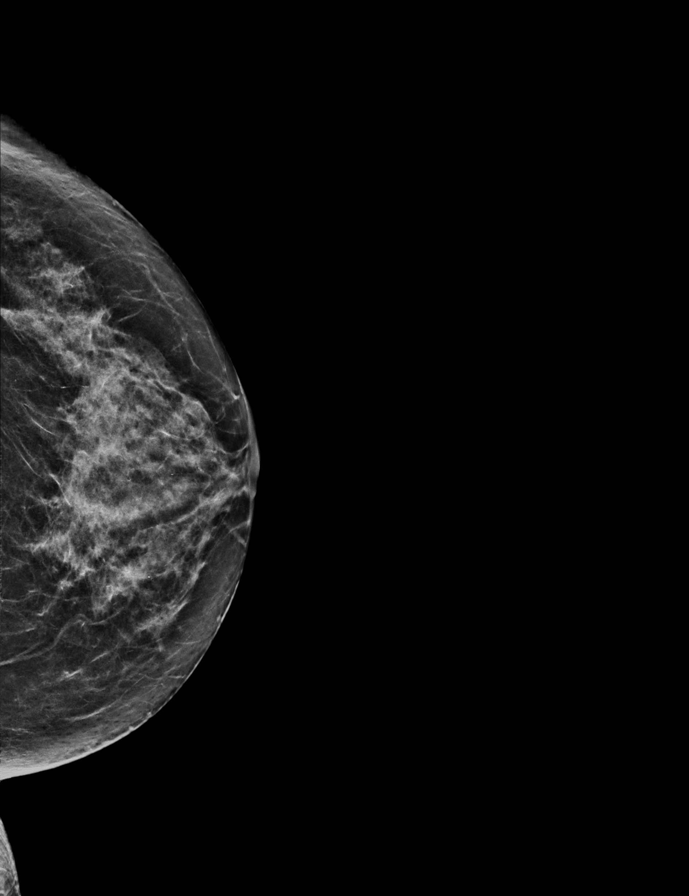

[R MLO synth-2D]
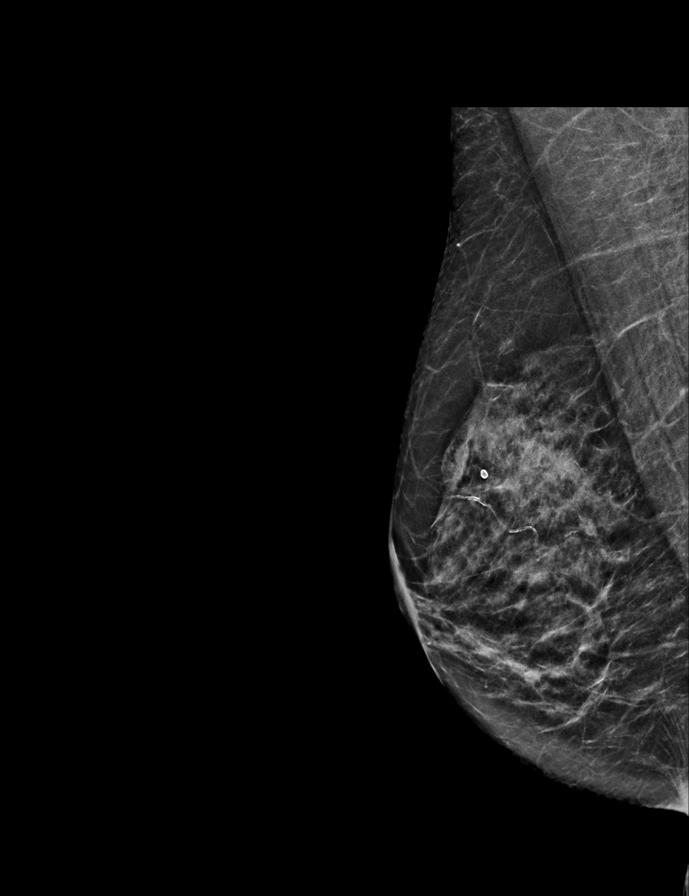

[L MLO tomo · 2 of 47 frames shown]
[frame 16/47]
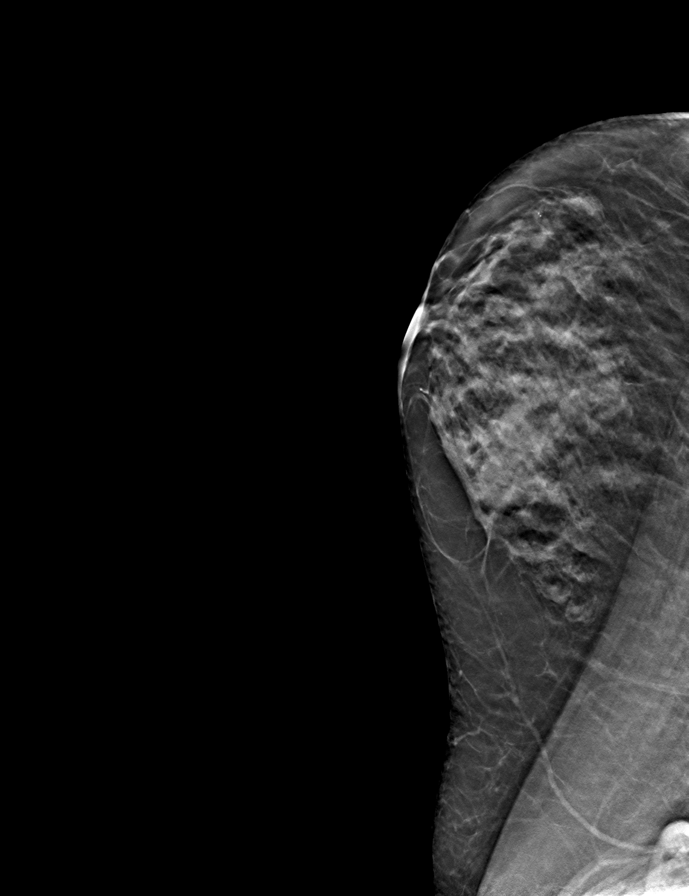
[frame 24/47]
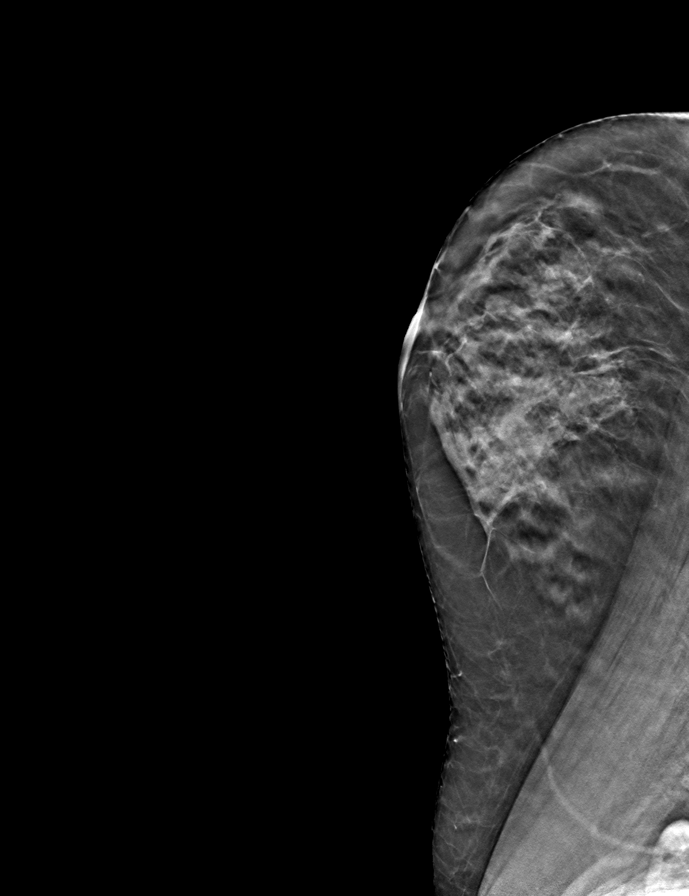

[R CC tomo · tomo slice 25/50.0]
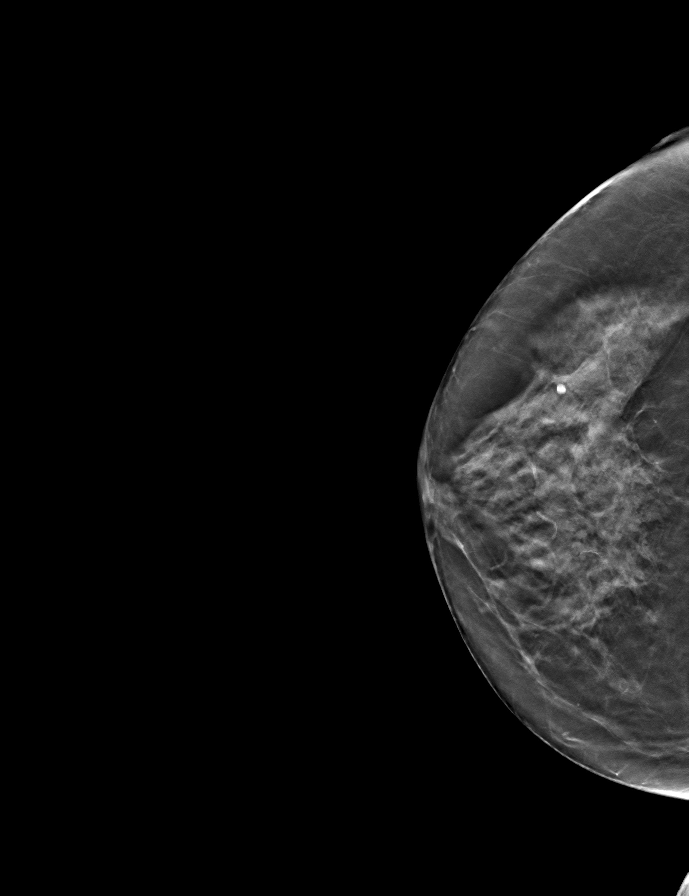

[R MLO tomo · tomo slice 25/50.0]
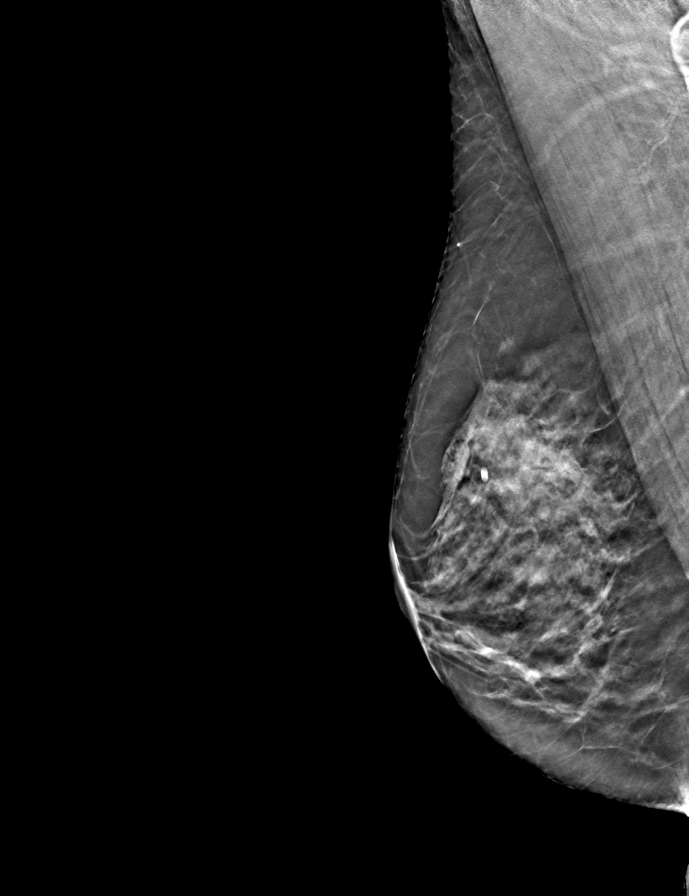

[L CC tomo · tomo slice 25/50.0]
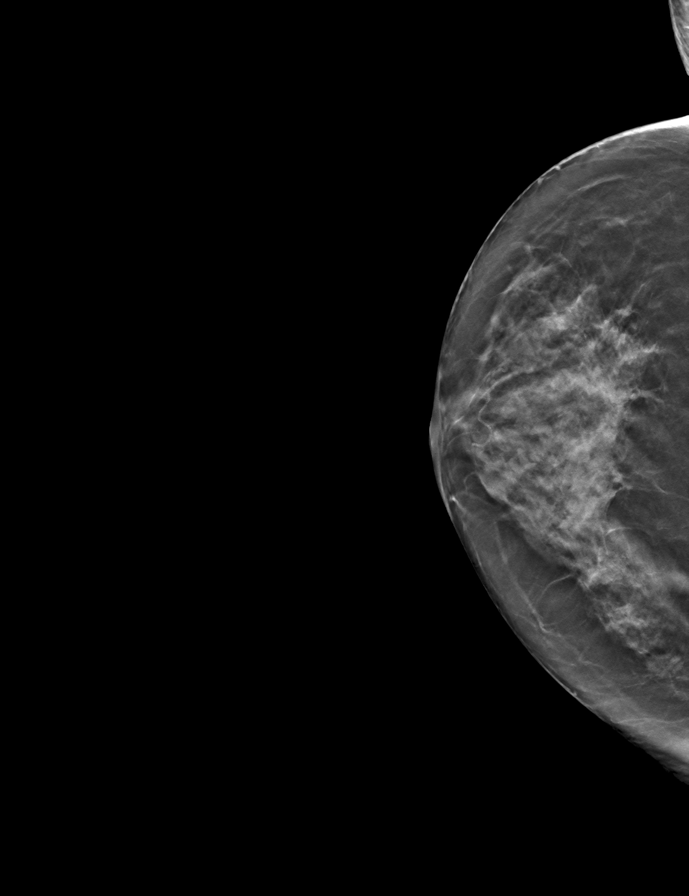

[9 of 24 positions shown; findings below may reference images not displayed]

ACR Breast Density Category c: The breast tissue is heterogeneously
dense, which may obscure small masses.
FINDINGS: There are no findings suspicious for malignancy. The images were
evaluated with computer-aided detection.
IMPRESSION: No mammographic evidence of malignancy. A result letter of this
screening mammogram will be mailed directly to the patient.

RECOMMENDATION:
Screening mammogram in one year. (Code:T4-5-GWO)

BI-RADS CATEGORY  1: Negative.

## 2021-11-05 ENCOUNTER — Encounter: Payer: BC Managed Care – PPO | Admitting: Family

## 2021-11-30 ENCOUNTER — Encounter: Payer: Self-pay | Admitting: Family

## 2021-11-30 ENCOUNTER — Other Ambulatory Visit (HOSPITAL_COMMUNITY)
Admission: RE | Admit: 2021-11-30 | Discharge: 2021-11-30 | Disposition: A | Discharge status: Home / Self Care | Payer: Commercial Managed Care - PPO | Source: Ambulatory Visit | Attending: Family | Admitting: Family

## 2021-11-30 ENCOUNTER — Ambulatory Visit (INDEPENDENT_AMBULATORY_CARE_PROVIDER_SITE_OTHER): Payer: Commercial Managed Care - PPO | Admitting: Family

## 2021-11-30 VITALS — BP 162/91 | HR 79 | Temp 98.1°F | Resp 16 | Ht 61.0 in | Wt 123.0 lb

## 2021-11-30 DIAGNOSIS — I10 Essential (primary) hypertension: Secondary | ICD-10-CM

## 2021-11-30 DIAGNOSIS — F17210 Nicotine dependence, cigarettes, uncomplicated: Secondary | ICD-10-CM

## 2021-11-30 DIAGNOSIS — Z01419 Encounter for gynecological examination (general) (routine) without abnormal findings: Secondary | ICD-10-CM | POA: Insufficient documentation

## 2021-11-30 DIAGNOSIS — Z0000 Encounter for general adult medical examination without abnormal findings: Secondary | ICD-10-CM | POA: Diagnosis not present

## 2021-11-30 DIAGNOSIS — R7303 Prediabetes: Secondary | ICD-10-CM

## 2021-11-30 DIAGNOSIS — E785 Hyperlipidemia, unspecified: Secondary | ICD-10-CM | POA: Diagnosis not present

## 2021-11-30 DIAGNOSIS — M810 Age-related osteoporosis without current pathological fracture: Secondary | ICD-10-CM | POA: Diagnosis not present

## 2021-11-30 LAB — COMPREHENSIVE METABOLIC PANEL
ALT: 9 U/L (ref 0–35)
AST: 14 U/L (ref 0–37)
Albumin: 4.5 g/dL (ref 3.5–5.2)
Alkaline Phosphatase: 73 U/L (ref 39–117)
BUN: 12 mg/dL (ref 6–23)
CO2: 29 mEq/L (ref 19–32)
Calcium: 9.6 mg/dL (ref 8.4–10.5)
Chloride: 103 mEq/L (ref 96–112)
Creatinine, Ser: 0.78 mg/dL (ref 0.40–1.20)
GFR: 80.68 mL/min (ref >60.00)
Glucose, Bld: 88 mg/dL (ref 70–99)
Potassium: 4 mEq/L (ref 3.5–5.1)
Sodium: 139 mEq/L (ref 135–145)
Total Bilirubin: 0.5 mg/dL (ref 0.2–1.2)
Total Protein: 6.9 g/dL (ref 6.0–8.3)

## 2021-11-30 LAB — HEMOGLOBIN A1C: Hgb A1c MFr Bld: 5.8 % (ref 4.6–6.5)

## 2021-11-30 LAB — LIPID PANEL
Cholesterol: 239 mg/dL — ABNORMAL HIGH (ref 0–200)
HDL: 73.4 mg/dL (ref >39.00)
LDL Cholesterol: 148 mg/dL — ABNORMAL HIGH (ref 0–99)
NonHDL: 165.98
Total CHOL/HDL Ratio: 3
Triglycerides: 89 mg/dL (ref 0.0–149.0)
VLDL: 17.8 mg/dL (ref 0.0–40.0)

## 2021-11-30 NOTE — Assessment & Plan Note (Signed)
Pt counseled on smoking cessation. She is not yet ready, but she will let me know when she is ready.  ?

## 2021-11-30 NOTE — Patient Instructions (Signed)
Please complete lab work prior to leaving. ?Restart blood pressure medication.  ?

## 2021-11-30 NOTE — Progress Notes (Signed)
? ?Subjective:  ? ?By signing my name below, I, Carylon Perches, attest that this documentation has been prepared under the direction and in the presence of Debbrah Alar NP, 11/30/2021  ? ? Patient ID: Joy Olson, female    DOB: Apr 18, 1956, 66 y.o.   MRN: [id number] ? ?No chief complaint on file. ? ? ?HPI ?Patient is in today for a comprehensive physical exam. ? ?Blood Pressure - She has not taken her blood pressure medications for the past three days. She states that she forgot her medications while packing for her trip.  ?BP Readings from Last 3 Encounters:  ?11/30/21 (!) 162/91  ?05/07/21 (!) 141/66  ?11/02/20 119/66  ? ?Pulse Readings from Last 3 Encounters:  ?11/30/21 79  ?05/07/21 64  ?11/02/20 64  ? ?Fosamax/Osteoporosis - She has not taken her Fosamax due to multiple bad reviews. She has been continuing to smoke.  ? ?She denies having any fever, ear pain, new muscle pain, joint pain, new moles, congestion, sinus pain, sore throat, palpations, wheezing, n/v/d, constipation, blood in stool, dysuria, frequency, hematuria, headaches, depresssion or  anxiety at this time. ? ?Social History: There has been no changes to her family medical history. She reports no recent surgeries.  ?Colonoscopy: Last completed on 03/25/2016. She was due for a colonoscopy in 2022. ?Dexa: Last completed on 12/24/2020 ?Pap Smear: Last completed on 01/24/2018 ?Mammogram: Last completed on 12/24/2020 ?Immunizations: She is UTD on her TDAP and Covid - 19 vaccines.  ?Diet: She has been okay in terms of her diet. She is conscious about what she consumes. Occasionally she snacks on [employer] cups.  ?Exercise: She walks daily.  ?Dental: She is UTD on dental exams.  ?Vision: She is UTD on vision exams.  ? ? ?Health Maintenance Due  ?Topic Date Due  ? PAP SMEAR-Modifier  01/24/2021  ? COLONOSCOPY (Pts 45-67yr Insurance coverage will need to be confirmed)  03/25/2021  ? ? ?Past Medical History:  ?Diagnosis Date  ? Borderline diabetes  07/26/2016  ? Hypertension   ? Osteopenia 02/02/2016  ? ? ?Past Surgical History:  ?Procedure Laterality Date  ? CHOLECYSTECTOMY N/A 09/07/2020  ? Procedure: LAPAROSCOPIC CHOLECYSTECTOMY;  Surgeon: LJesusita Oka MD;  [facility]  Service: General;  Laterality: N/A;  ? [place] ? ? ?Family History  ?Problem Relation Age of Onset  ? Heart disease Mother   ?     hx chf, died at aage51 ? Heart disease Maternal Grandmother   ? Heart attack Maternal Grandfather   ? Colon cancer Neg Hx   ? ? ?Social History  ? ?Socioeconomic History  ? Marital status: Married  ?  Spouse name: Not on file  ? Number of children: 2  ? Years of education: Not on file  ? Highest education level: Not on file  ?Occupational History  ? Not on file  ?Tobacco Use  ? Smoking status: Every Day  ?  Packs/day: 0.50  ?  Years: 40.00  ?  Pack years: 20.00  ?  Types: Cigarettes  ?  Last attempt to quit: 01/27/2017  ?  Years since quitting: 4.8  ? Smokeless tobacco: Never  ? Tobacco comments:  ?  10-12 cigarettes a day  ?Vaping Use  ? Vaping Use: Never used  ?Substance and Sexual Activity  ? Alcohol use: Yes  ?  Comment: occasional  ? Drug use: No  ? Sexual activity: Yes  ?  Partners: Male  ?Other Topics Concern  ? Not  on file  ?Social History Narrative  ? Son born 72 lives in [place]  ? Daughter 1988 live in [place]  ? 3 grandchildren (2 boys and girl)  ? 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 Married 16 years- husband is Office manager  ? Enjoys working in the yard (gypsy garden) reading, watching movies  ? Grew up in [place]  ? ?Social Determinants of Health  ? ?Financial Resource Strain: Not on file  ?Food Insecurity: Not on file  ?Transportation Needs: Not on file  ?Physical Activity: Not on file  ?Stress: Not on file  ?Social Connections: Not on file  ?Intimate Partner Violence: Not on file  ? ? ?Outpatient Medications Prior to Visit  ?Medication Sig Dispense Refill  ?  amLODipine (NORVASC) 10 MG tablet TAKE 1 TABLET (10 MG TOTAL) BY MOUTH DAILY. 90 tablet 1  ? COVID-19 mRNA bivalent vaccine, Pfizer, injection Inject into the muscle. 0.3 mL 0  ? ibuprofen (ADVIL) 800 MG tablet TAKE 1 TABLET (800 MG TOTAL) BY MOUTH EVERY 8 (EIGHT) HOURS AS NEEDED. 30 tablet 0  ? losartan-hydrochlorothiazide (HYZAAR) 100-25 MG tablet TAKE 1 TABLET BY MOUTH DAILY. 90 tablet 1  ? meloxicam (MOBIC) 7.5 MG tablet Take 1 tablet (7.5 mg total) by mouth daily. 14 tablet 0  ? metoprolol succinate (TOPROL-XL) 50 MG 24 hr tablet TAKE 1 TABLET (50 MG TOTAL) BY MOUTH DAILY. TAKE WITH OR IMMEDIATELY FOLLOWING A MEAL. 90 tablet 1  ? methylPREDNISolone (MEDROL DOSEPAK) 4 MG TBPK tablet Take by mouth as directed by package 21 tablet 0  ? ?Facility-Administered Medications Prior to Visit  ?Medication Dose Route Frequency Provider Last Rate Last Admin  ? 0.9 %  sodium chloride infusion  500 mL Intravenous Continuous Danis, Kirke Corin, MD      ? ? ?No Known Allergies ? ?Review of Systems  ?Constitutional:  Negative for fever.  ?HENT:  Negative for congestion, sinus pain and sore throat.   ?Respiratory:  Negative for wheezing.   ?Cardiovascular:  Negative for palpitations.  ?Gastrointestinal:  Negative for blood in stool, constipation, diarrhea, nausea and vomiting.  ?Genitourinary:  Negative for dysuria, frequency and hematuria.  ?Musculoskeletal:  Negative for joint pain and myalgias.  ?Skin:   ?     (-) [place]  ?Neurological:  Negative for headaches.  ?Psychiatric/Behavioral:  Negative for depression. The patient is not nervous/anxious.   ? ?   ?Objective:  ?  ?Physical Exam ?Exam conducted with a chaperone present.  ?Constitutional:   ?   General: She is not in acute distress. ?   Appearance: Normal appearance. She is not ill-appearing.  ?HENT:  ?   Head: Normocephalic and atraumatic.  ?   Right Ear: Tympanic membrane, ear canal and external ear normal.  ?   Left Ear: Tympanic membrane, ear canal and external  ear normal.  ?Eyes:  ?   Extraocular Movements: Extraocular movements intact.  ?   Pupils: Pupils are equal, round, and reactive to light.  ?Cardiovascular:  ?   Rate and Rhythm: Normal rate and regular rhythm.  ?   Heart sounds: Normal heart sounds. No murmur heard. ?  No gallop.  ?Pulmonary:  ?   Effort: Pulmonary effort is normal. No respiratory distress.  ?   Breath sounds: Normal breath sounds. No wheezing or rales.  ?Chest:  ?Breasts: ?   Breasts are symmetrical.  ?   Right: Normal. No inverted nipple  or mass.  ?   Left: Normal. No inverted nipple or mass.  ?Abdominal:  ?   General: Bowel sounds are normal. There is no distension.  ?   Palpations: Abdomen is soft.  ?   Tenderness: There is no abdominal tenderness. There is no guarding.  ?Genitourinary: ?   Exam position: Lithotomy position.  ?   Pubic Area: No rash.   ?   Labia:     ?   Right: No rash or lesion.     ?   Left: No rash or lesion.   ?   Vagina: Normal.  ?   Cervix: No cervical motion tenderness or discharge.  ?   Uterus: Normal.   ?   Adnexa: Right adnexa normal and left adnexa normal.    ?   Right: No tenderness.      ?   Left: No tenderness.    ?Musculoskeletal:  ?   Comments: 5/5 strength in both upper and lower extremities   ?Skin: ?   General: Skin is warm and dry.  ?Neurological:  ?   Mental Status: She is alert and oriented to person, place, and time.  ?   Deep Tendon Reflexes:  ?   Reflex Scores: ?     Patellar reflexes are 3+ on the right side and 3+ on the left side. ?Psychiatric:     ?   Mood and Affect: Mood normal.     ?   Behavior: Behavior normal.     ?   Judgment: Judgment normal.  ? ? ?BP (!) 162/91 (BP Location: Right Arm, Patient Position: Sitting, Cuff Size: Small)   Pulse 79   Temp 98.1 ?F (36.7 ?C) (Oral)   Resp 16   Ht '5\' 1"'$  (1.549 m)   Wt 123 lb (55.8 kg)   LMP 08/15/2009   SpO2 100%   BMI 23.24 kg/m?  ?Wt Readings from Last 3 Encounters:  ?11/30/21 123 lb (55.8 kg)  ?05/07/21 119 lb (54 kg)  ?11/02/20 116 lb 12.8  oz (53 kg)  ? ? ?   ?Assessment & Plan:  ? ?Problem List Items Addressed This Visit   ? ?  ? Unprioritized  ? Preventative health care - Primary  ?  Continue healthy diet, exercise efforts. Mammo up to date. Refer for

## 2021-11-30 NOTE — Assessment & Plan Note (Signed)
BP is elevated. Forgot to take her meds out of town. Advised pt to restart BP meds.  ?

## 2021-11-30 NOTE — Assessment & Plan Note (Signed)
Continue healthy diet, exercise efforts. Mammo up to date. Refer for colo, Pap performed today.  ?

## 2021-11-30 NOTE — Assessment & Plan Note (Signed)
Will obtain follow up A1C.  ?

## 2021-11-30 NOTE — Assessment & Plan Note (Signed)
Pt counseled on the risks/benefits of treatment with fosamax or prolia.  She will think about it.  ?

## 2021-12-01 LAB — CYTOLOGY - PAP
Adequacy: ABSENT
Comment: NEGATIVE
Diagnosis: NEGATIVE
High risk HPV: NEGATIVE

## 2022-03-01 ENCOUNTER — Ambulatory Visit (INDEPENDENT_AMBULATORY_CARE_PROVIDER_SITE_OTHER): Payer: Commercial Managed Care - PPO | Admitting: Family

## 2022-03-01 ENCOUNTER — Other Ambulatory Visit (HOSPITAL_BASED_OUTPATIENT_CLINIC_OR_DEPARTMENT_OTHER): Payer: Self-pay

## 2022-03-01 DIAGNOSIS — F17210 Nicotine dependence, cigarettes, uncomplicated: Secondary | ICD-10-CM

## 2022-03-01 DIAGNOSIS — R7303 Prediabetes: Secondary | ICD-10-CM

## 2022-03-01 DIAGNOSIS — M810 Age-related osteoporosis without current pathological fracture: Secondary | ICD-10-CM | POA: Diagnosis not present

## 2022-03-01 DIAGNOSIS — I10 Essential (primary) hypertension: Secondary | ICD-10-CM | POA: Diagnosis not present

## 2022-03-01 LAB — HEMOGLOBIN A1C: Hgb A1c MFr Bld: 5.9 % (ref 4.6–6.5)

## 2022-03-01 LAB — BASIC METABOLIC PANEL
BUN: 16 mg/dL (ref 6–23)
CO2: 29 mEq/L (ref 19–32)
Calcium: 9.9 mg/dL (ref 8.4–10.5)
Chloride: 98 mEq/L (ref 96–112)
Creatinine, Ser: 0.79 mg/dL (ref 0.40–1.20)
GFR: 79.31 mL/min (ref >60.00)
Glucose, Bld: 98 mg/dL (ref 70–99)
Potassium: 4.2 mEq/L (ref 3.5–5.1)
Sodium: 136 mEq/L (ref 135–145)

## 2022-03-01 MED ORDER — AMLODIPINE BESYLATE 10 MG PO TABS
ORAL_TABLET | Freq: Every day | ORAL | 1 refills | Status: DC
  Filled 2022-03-01 – 2022-06-12 (×2): qty 90, 90d supply, fill #0
  Filled 2023-01-26: qty 90, 90d supply, fill #1

## 2022-03-01 MED ORDER — METOPROLOL SUCCINATE ER 50 MG PO TB24
ORAL_TABLET | Freq: Every day | ORAL | 1 refills | Status: DC
  Filled 2022-03-01 – 2022-06-12 (×2): qty 90, 90d supply, fill #0
  Filled 2023-01-26: qty 90, 90d supply, fill #1

## 2022-03-01 MED ORDER — LOSARTAN POTASSIUM-HCTZ 100-25 MG PO TABS
1.0000 | ORAL_TABLET | Freq: Every day | ORAL | 1 refills | Status: DC
  Filled 2022-03-01 – 2022-06-12 (×2): qty 90, 90d supply, fill #0
  Filled 2023-01-26: qty 90, 90d supply, fill #1

## 2022-03-01 NOTE — Progress Notes (Signed)
Subjective:   By signing my name below, I, Kellie Simmering, attest that this documentation has been prepared under the direction and in the presence of Debbrah Alar, NP 03/01/2022.   Patient ID: Joy Olson, female    DOB: 1956/01/11, 66 y.o.   MRN: [id number]  Chief Complaint  Patient presents with   Hypertension    Here for follow up    HPI Patient is in today for an office visit.  Refills- She is requesting refills on her Amlodipine 10 mg, Losartan 100-25 mg and Metoprolol Succinate 50 mg  Blood pressure- She reports that her blood pressure is doing well. he is currently taking Amlodipine 10 mg, Losartan 100-25 mg and Metoprolol Succinate 50 mg to manage her blood pressure. BP Readings from Last 3 Encounters:  03/01/22 131/73  11/30/21 (!) 162/91  05/07/21 (!) 141/66   Pulse Readings from Last 3 Encounters:  03/01/22 70  11/30/21 79  05/07/21 64   Diet- She reports that her diet is well maintained and she has reduced her candy intake.  Colonoscopy- She has not yet scheduled a colonoscopy but plans to do so.  Mammogram- She has a scheduled mammogram.   Smoking- She reports that she has been attempting to quit smoking. Her husband is a smoker.   Osteoporosis- She has been diagnosed with osteoporosis.  Past Medical History:  Diagnosis Date   Borderline diabetes 07/26/2016   Hypertension    Osteopenia 02/02/2016    Past Surgical History:  Procedure Laterality Date   CHOLECYSTECTOMY N/A 09/07/2020   Procedure: LAPAROSCOPIC CHOLECYSTECTOMY;  Surgeon: [name], MD;  [facility];  Service: General;  Laterality: N/A;   TUBAL LIGATION  1988    Family History  Problem Relation Age of Onset   Heart disease Mother        hx chf, died at age64   Heart disease Maternal Grandmother    Heart attack Maternal Grandfather    Colon cancer Neg Hx     Social History   Socioeconomic History   Marital status: Married    Spouse name: Not on file    Number of children: 2   Years of education: Not on file   Highest education level: Not on file  Occupational History   Not on file  Tobacco Use   Smoking status: Every Day    Packs/day: 0.50    Years: 40.00    Total pack years: 20.00    Types: Cigarettes    Last attempt to quit: 01/27/2017    Years since quitting: 5.0   Smokeless tobacco: Never   Tobacco comments:    10-12 cigarettes a day  Vaping Use   Vaping Use: Never used  Substance and Sexual Activity   Alcohol use: Yes    Comment: occasional   Drug use: No   Sexual activity: Yes    Partners: Male  Other Topics Concern   Not on file  Social History Narrative   Son born 50 lives in [place] live in [place]   3 grandchildren (2 boys and girl)   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 0.3 mL 0   amLODipine (NORVASC) 10 MG tablet TAKE 1 TABLET (10 MG TOTAL) BY MOUTH DAILY. 90 tablet 1   losartan-hydrochlorothiazide (HYZAAR) 100-25 MG tablet TAKE 1 TABLET BY MOUTH DAILY. 90 tablet 1   metoprolol succinate (TOPROL-XL) 50 MG 24 hr tablet TAKE 1 TABLET (50 MG TOTAL) BY MOUTH DAILY. TAKE WITH OR IMMEDIATELY FOLLOWING A MEAL. 90 tablet 1   ibuprofen (ADVIL) 800 MG tablet TAKE 1 TABLET (800 MG TOTAL) BY MOUTH EVERY 8 (EIGHT) HOURS AS NEEDED. 30 tablet 0   meloxicam (MOBIC) 7.5 MG tablet Take 1 tablet (7.5 mg  total) by mouth daily. 14 tablet 0   Facility-Administered Medications Prior to Visit  Medication Dose Route Frequency Provider Last Rate Last Admin   0.9 %  sodium chloride infusion  500 mL Intravenous Continuous Danis, Estill Cotta III, MD        No Known Allergies  ROS     Objective:    Physical Exam Constitutional:      General: She is not in acute distress.    Appearance: Normal appearance. She is not ill-appearing.  HENT:     Head: Normocephalic and atraumatic.     Right Ear: External ear normal.     Left Ear: External ear normal.  Eyes:     Extraocular Movements: Extraocular movements intact.     Pupils: Pupils are equal, round, and reactive to light.  Cardiovascular:     Rate and Rhythm: Normal rate and regular rhythm.     Pulses: Normal pulses.     Heart sounds: Normal heart sounds. No murmur heard.    No gallop.  Pulmonary:     Effort: Pulmonary effort is normal. No respiratory distress.     Breath sounds: Normal breath sounds. No wheezing or rales.  Skin:    General: Skin is warm and dry.  Neurological:     Mental Status: She is alert and oriented to person, place, and time.  Psychiatric:        Mood and Affect: Mood normal.        Behavior: Behavior normal.        Judgment: Judgment normal.     BP 131/73 (BP Location: Right Arm, Patient Position: Sitting, Cuff Size: Small)   Pulse 70   Temp 98.5 F (36.9 C) (Oral)   Resp 16   Wt 119 lb (54 kg)   LMP 08/15/2009   SpO2 99%   BMI 22.48 kg/m  Wt Readings from Last 3 Encounters:  03/01/22 119 lb (54 kg)  11/30/21 123 lb (55.8 kg)  05/07/21 119 lb (54 kg)      Assessment & Plan:   Problem List Items Addressed This Visit       Unprioritized   Osteoporosis    Declines treatment for osteoporosis.       Nicotine dependence    Not yet ready to quit.  She states that she is "getting closer" to being ready.       HTN (hypertension)    BP Readings from Last 3 Encounters:  03/01/22 131/73  11/30/21  (!) 162/91  05/07/21 (!) 141/66  BP looks great. Continue toprol xl, hyzaar, and amlodipine.  Relevant Medications   metoprolol succinate (TOPROL-XL) 50 MG 24 hr tablet   losartan-hydrochlorothiazide (HYZAAR) 100-25 MG tablet   amLODipine (NORVASC) 10 MG tablet   Borderline diabetes    Lab Results  Component Value Date   HGBA1C 5.8 11/30/2021   HGBA1C 5.7 05/07/2021   HGBA1C 6.0 07/26/2016   Lab Results  Component Value Date   LDLCALC 148 (H) 11/30/2021   CREATININE 0.78 11/30/2021  Last A1C at goal. Continue DM diet.       Relevant Orders   Basic metabolic panel   Hemoglobin A1c    Meds ordered this encounter  Medications   metoprolol succinate (TOPROL-XL) 50 MG 24 hr tablet    Sig: TAKE 1 TABLET (50 MG TOTAL) BY MOUTH DAILY. TAKE WITH OR IMMEDIATELY FOLLOWING A MEAL.    Dispense:  90 tablet    Refill:  1    Order Specific Question:   Supervising Provider    Answer:   Penni Homans A [[id number]]   losartan-hydrochlorothiazide (HYZAAR) 100-25 MG tablet    Sig: TAKE 1 TABLET BY MOUTH DAILY.    Dispense:  90 tablet    Refill:  1    Order Specific Question:   Supervising Provider    Answer:   Penni Homans A [[id number]]   amLODipine (NORVASC) 10 MG tablet    Sig: TAKE 1 TABLET (10 MG TOTAL) BY MOUTH DAILY.    Dispense:  90 tablet    Refill:  1    Order Specific Question:   Supervising Provider    Answer:   Penni Homans A [[id number]]    I, Nance Pear, NP, personally preformed the services described in this documentation.  All medical record entries made by the scribe were at my direction and in my presence.  I have reviewed the chart and discharge instructions (if applicable) and agree that the record reflects my personal performance and is accurate and complete. 03/01/2022.  I,Mohammed Iqbal,acting as a Education administrator for [employer], NP.,have documented all relevant documentation on the behalf of Nance Pear, NP,as directed by  Nance Pear, NP  while in the presence of Nance Pear, NP.  Nance Pear, NP

## 2022-03-01 NOTE — Assessment & Plan Note (Signed)
Lab Results  Component Value Date   HGBA1C 5.8 11/30/2021   HGBA1C 5.7 05/07/2021   HGBA1C 6.0 07/26/2016   Lab Results  Component Value Date   LDLCALC 148 (H) 11/30/2021   CREATININE 0.78 11/30/2021   Last A1C at goal. Continue DM diet.

## 2022-03-01 NOTE — Assessment & Plan Note (Signed)
BP Readings from Last 3 Encounters:  03/01/22 131/73  11/30/21 (!) 162/91  05/07/21 (!) 141/66   BP looks great. Continue toprol xl, hyzaar, and amlodipine.

## 2022-03-01 NOTE — Assessment & Plan Note (Signed)
Declines treatment for osteoporosis.

## 2022-03-01 NOTE — Assessment & Plan Note (Signed)
Not yet ready to quit.  She states that she is "getting closer" to being ready.

## 2022-03-01 NOTE — Patient Instructions (Signed)
Please schedule mammogram and colonoscopy

## 2022-03-09 ENCOUNTER — Other Ambulatory Visit (HOSPITAL_BASED_OUTPATIENT_CLINIC_OR_DEPARTMENT_OTHER): Payer: Self-pay

## 2022-06-08 ENCOUNTER — Other Ambulatory Visit (HOSPITAL_BASED_OUTPATIENT_CLINIC_OR_DEPARTMENT_OTHER): Payer: Self-pay | Admitting: Family

## 2022-06-08 DIAGNOSIS — Z1231 Encounter for screening mammogram for malignant neoplasm of breast: Secondary | ICD-10-CM

## 2022-06-13 ENCOUNTER — Other Ambulatory Visit (HOSPITAL_BASED_OUTPATIENT_CLINIC_OR_DEPARTMENT_OTHER): Payer: Self-pay

## 2022-06-14 ENCOUNTER — Ambulatory Visit (HOSPITAL_BASED_OUTPATIENT_CLINIC_OR_DEPARTMENT_OTHER)
Admission: RE | Admit: 2022-06-14 | Discharge: 2022-06-14 | Disposition: A | Discharge status: Home / Self Care | Payer: Commercial Managed Care - PPO | Source: Ambulatory Visit | Attending: Family | Admitting: Family

## 2022-06-14 ENCOUNTER — Encounter (HOSPITAL_BASED_OUTPATIENT_CLINIC_OR_DEPARTMENT_OTHER): Payer: Self-pay

## 2022-06-14 ENCOUNTER — Other Ambulatory Visit (HOSPITAL_BASED_OUTPATIENT_CLINIC_OR_DEPARTMENT_OTHER): Payer: Self-pay

## 2022-06-14 DIAGNOSIS — Z1231 Encounter for screening mammogram for malignant neoplasm of breast: Secondary | ICD-10-CM | POA: Diagnosis present

## 2022-09-02 ENCOUNTER — Encounter: Payer: Self-pay | Admitting: Family

## 2022-09-02 ENCOUNTER — Ambulatory Visit (INDEPENDENT_AMBULATORY_CARE_PROVIDER_SITE_OTHER): Payer: Commercial Managed Care - PPO | Admitting: Family

## 2022-09-02 ENCOUNTER — Other Ambulatory Visit (HOSPITAL_BASED_OUTPATIENT_CLINIC_OR_DEPARTMENT_OTHER): Payer: Self-pay

## 2022-09-02 VITALS — BP 132/72 | HR 68 | Temp 98.2°F | Resp 16 | Wt 118.0 lb

## 2022-09-02 DIAGNOSIS — I10 Essential (primary) hypertension: Secondary | ICD-10-CM

## 2022-09-02 DIAGNOSIS — M5431 Sciatica, right side: Secondary | ICD-10-CM | POA: Diagnosis not present

## 2022-09-02 DIAGNOSIS — F419 Anxiety disorder, unspecified: Secondary | ICD-10-CM | POA: Insufficient documentation

## 2022-09-02 DIAGNOSIS — R7303 Prediabetes: Secondary | ICD-10-CM | POA: Diagnosis not present

## 2022-09-02 DIAGNOSIS — E785 Hyperlipidemia, unspecified: Secondary | ICD-10-CM | POA: Diagnosis not present

## 2022-09-02 DIAGNOSIS — Z23 Encounter for immunization: Secondary | ICD-10-CM

## 2022-09-02 DIAGNOSIS — F32A Depression, unspecified: Secondary | ICD-10-CM

## 2022-09-02 DIAGNOSIS — F17210 Nicotine dependence, cigarettes, uncomplicated: Secondary | ICD-10-CM

## 2022-09-02 MED ORDER — CITALOPRAM HYDROBROMIDE 20 MG PO TABS
ORAL_TABLET | ORAL | 0 refills | Status: DC
  Filled 2022-09-02: qty 30, 35d supply, fill #0

## 2022-09-02 MED ORDER — METHYLPREDNISOLONE 4 MG PO TBPK
ORAL_TABLET | ORAL | 0 refills | Status: DC
  Filled 2022-09-02: qty 21, 6d supply, fill #0

## 2022-09-02 NOTE — Assessment & Plan Note (Signed)
Uncontrolled. Recommended trial of medrol dose pak. She will reach out to me if her symptoms are not improved in 1 week. Would plan PT referral at that time.

## 2022-09-02 NOTE — Assessment & Plan Note (Signed)
BP at goal. Continue amlodipine, hyzaar and metoprolol.

## 2022-09-02 NOTE — Progress Notes (Signed)
Subjective:     Patient ID: Joy Olson, female    DOB: [birth year], 67 y.o.   MRN: [id number]  Chief Complaint  Patient presents with   Hypertension    Here for follow up    Hypertension   Patient is in today for follow up.  HTN-  BP Readings from Last 3 Encounters:  09/02/22 132/72  03/01/22 131/73  11/30/21 (!) 162/91   BP meds include amlodipine, hyzaar and metoprolol.   Borderline diabetes-  Lab Results  Component Value Date   HGBA1C 5.9 03/01/2022   HGBA1C 5.8 11/30/2021   HGBA1C 5.7 05/07/2021   Lab Results  Component Value Date   LDLCALC 148 (H) 11/30/2021   CREATININE 0.79 03/01/2022   She is working on cutting down on her cigarettes. Declines lung cancer screening.   R sided sciatica.  Has been bothering her off/on since 2022. Pain starts in the right buttock and runs down the back of her right leg.  Has tried some home exercises without improvement. She has also used meloxicam in the past without improvement.   Irritability- Notes that she has had hot flashes, increased irritability, having more difficulty motivating to do things she used to enjoy doing.   Health Maintenance Due  Topic Date Due   Lung Cancer Screening  Never done   Zoster Vaccines- Shingrix (1 of 2) Never done   COLONOSCOPY (Pts 45-60yr Insurance coverage will need to be confirmed)  03/25/2021   INFLUENZA VACCINE  03/15/2022   COVID-19 Vaccine (5 - 2023-24 season) 04/15/2022    Past Medical History:  Diagnosis Date   Borderline diabetes 07/26/2016   Calculus of gallbladder without cholecystitis without obstruction 02/03/2020   Hypertension    Osteopenia 02/02/2016    Past Surgical History:  Procedure Laterality Date   CHOLECYSTECTOMY N/A 09/07/2020   Procedure: LAPAROSCOPIC CHOLECYSTECTOMY;  Surgeon: LJesusita Oka MD;  Location: [facility] OR;  Service: General;  Laterality: N/A;   TUBAL LIGATION  1988    Family History  Problem Relation Age of Onset   Heart disease  Mother        hx chf, died at age64   Heart disease Maternal Grandmother    Heart attack Maternal Grandfather    Colon cancer Neg Hx     Social History   Socioeconomic History   Marital status: Married    Spouse name: Not on file   Number of children: 2   Years of education: Not on file   Highest education level: Not on file  Occupational History   Not on file  Tobacco Use   Smoking status: Every Day    Packs/day: 0.50    Years: 40.00    Total pack years: 20.00    Types: Cigarettes    Last attempt to quit: 01/27/2017    Years since quitting: 5.6   Smokeless tobacco: Never   Tobacco comments:    10-12 cigarettes a day  Vaping Use   Vaping Use: Never used  Substance and Sexual Activity   Alcohol use: Yes    Comment: occasional   Drug use: No   Sexual activity: Yes    Partners: Male  Other Topics Concern   Not on file  Social History Narrative   [place]  3 grandchildren (2 boys and girl)   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 90 tablet 1   losartan-hydrochlorothiazide (HYZAAR) 100-25 MG tablet TAKE 1 TABLET BY MOUTH DAILY. 90 tablet 1   metoprolol succinate (TOPROL-XL) 50 MG 24 hr tablet TAKE 1 TABLET (50 MG TOTAL) BY MOUTH DAILY. TAKE WITH OR IMMEDIATELY FOLLOWING A MEAL. 90 tablet 1   COVID-19 mRNA  bivalent vaccine, Pfizer, injection Inject into the muscle. 0.3 mL 0   Facility-Administered Medications Prior to Visit  Medication Dose Route Frequency Provider Last Rate Last Admin   0.9 %  sodium chloride infusion  500 mL Intravenous Continuous Danis, Estill Cotta III, MD        No Known Allergies  ROS See HPI    Objective:    Physical Exam Constitutional:      General: She is not in acute distress.    Appearance: Normal appearance. She is well-developed.  HENT:     Head: Normocephalic and atraumatic.     Right Ear: External ear normal.     Left Ear: External ear normal.  Eyes:     General: No scleral icterus. Neck:     Thyroid: No thyromegaly.  Cardiovascular:     Rate and Rhythm: Normal rate and regular rhythm.     Heart sounds: Normal heart sounds. No murmur heard. Pulmonary:     Effort: Pulmonary effort is normal. No respiratory distress.     Breath sounds: Normal breath sounds. No wheezing.  Musculoskeletal:     Cervical back: Neck supple.  Skin:    General: Skin is warm and dry.  Neurological:     Mental Status: She is alert and oriented to person, place, and time.  Psychiatric:        Mood and Affect: Mood normal.        Behavior: Behavior normal.        Thought Content: Thought content normal.        Judgment: Judgment normal.     BP 132/72 (BP Location: Right Arm, Patient Position: Sitting, Cuff Size: Small)   Pulse 68   Temp 98.2 F (36.8 C) (Oral)   Resp 16   Wt 118 lb (53.5 kg)   LMP 08/15/2009   SpO2 97%   BMI 22.30 kg/m  Wt Readings from Last 3 Encounters:  09/02/22 118 lb (53.5 kg)  03/01/22 119 lb (54 kg)  11/30/21 123 lb (55.8 kg)       Assessment & Plan:   Problem List Items Addressed This Visit       Unprioritized   Sciatica of right side    Uncontrolled. Recommended trial of medrol dose pak. She will reach out to me if her symptoms are not improved in 1 week. Would plan PT referral at that time.       Relevant Medications    methylPREDNISolone (MEDROL DOSEPAK) 4 MG TBPK tablet   citalopram (CELEXA) 20 MG tablet   Nicotine dependence    Encouraged  complete cessation.  Offered CT lung cancer screening- she declines.       HTN (hypertension)    BP at goal. Continue amlodipine, hyzaar and metoprolol.       Relevant Orders   Comp Met (CMET)   Depression    I think her irritability and motivation issues are likely due to some underlying depression.  Will give a trial of citalopram.        Relevant Medications   citalopram (CELEXA) 20 MG tablet   Borderline diabetes - Primary    Repeat A1C. Last A1c was WNL.       Relevant Orders   HgB A1c   Other Visit Diagnoses     Hyperlipidemia, unspecified hyperlipidemia type       Relevant Orders   Lipid panel       I have discontinued Joelene Millin Vivas's COVID-19 mRNA bivalent vaccine [employer]). I am also having her start on methylPREDNISolone and citalopram. Additionally, I am having her maintain her metoprolol succinate, losartan-hydrochlorothiazide, and amLODipine. We will continue to administer sodium chloride.  Meds ordered this encounter  Medications   methylPREDNISolone (MEDROL DOSEPAK) 4 MG TBPK tablet    Sig: Take by mouth per package instructions    Dispense:  21 tablet    Refill:  0    Order Specific Question:   Supervising Provider    Answer:   Penni Homans A [[id number]]   citalopram (CELEXA) 20 MG tablet    Sig: Take 1/2 tablet by mouth once daily for 1 week, then increase to a full tablet on week two.    Dispense:  30 tablet    Refill:  0    Order Specific Question:   Supervising Provider    Answer:   Penni Homans A [[id number]]

## 2022-09-02 NOTE — Assessment & Plan Note (Signed)
Encouraged complete cessation.  Offered CT lung cancer screening- she declines.

## 2022-09-02 NOTE — Assessment & Plan Note (Signed)
I think her irritability and motivation issues are likely due to some underlying depression.  Will give a trial of citalopram.

## 2022-09-02 NOTE — Assessment & Plan Note (Signed)
Repeat A1C. Last A1c was WNL.

## 2022-09-02 NOTE — Addendum Note (Signed)
Addended by: Jiles Prows on: 09/02/2022 09:24 AM   Modules accepted: Orders

## 2022-09-13 ENCOUNTER — Encounter: Payer: Self-pay | Admitting: Family

## 2022-09-30 ENCOUNTER — Encounter: Payer: Self-pay | Admitting: Family

## 2022-10-10 ENCOUNTER — Ambulatory Visit: Payer: Commercial Managed Care - PPO | Admitting: Family

## 2022-11-14 ENCOUNTER — Encounter: Payer: Self-pay | Admitting: Family

## 2022-11-14 ENCOUNTER — Other Ambulatory Visit (HOSPITAL_BASED_OUTPATIENT_CLINIC_OR_DEPARTMENT_OTHER): Payer: Self-pay

## 2022-11-14 ENCOUNTER — Other Ambulatory Visit: Payer: Self-pay

## 2022-11-14 MED ORDER — ESOMEPRAZOLE MAGNESIUM 40 MG PO CPDR
40.0000 mg | DELAYED_RELEASE_CAPSULE | Freq: Every day | ORAL | 1 refills | Status: DC | PRN
  Filled 2022-11-14: qty 90, 90d supply, fill #0
  Filled 2023-02-13: qty 90, 90d supply, fill #1

## 2023-01-11 ENCOUNTER — Encounter: Payer: Self-pay | Admitting: Gastroenterology

## 2023-02-14 ENCOUNTER — Telehealth (HOSPITAL_BASED_OUTPATIENT_CLINIC_OR_DEPARTMENT_OTHER): Payer: Self-pay

## 2023-02-14 ENCOUNTER — Other Ambulatory Visit (HOSPITAL_BASED_OUTPATIENT_CLINIC_OR_DEPARTMENT_OTHER): Payer: Self-pay

## 2023-02-14 ENCOUNTER — Ambulatory Visit (INDEPENDENT_AMBULATORY_CARE_PROVIDER_SITE_OTHER): Payer: Commercial Managed Care - PPO | Admitting: Family

## 2023-02-14 ENCOUNTER — Telehealth: Payer: Self-pay | Admitting: Family

## 2023-02-14 VITALS — BP 130/74 | HR 72 | Resp 18 | Ht 61.0 in | Wt 114.0 lb

## 2023-02-14 DIAGNOSIS — E785 Hyperlipidemia, unspecified: Secondary | ICD-10-CM | POA: Diagnosis not present

## 2023-02-14 DIAGNOSIS — R7303 Prediabetes: Secondary | ICD-10-CM

## 2023-02-14 DIAGNOSIS — F419 Anxiety disorder, unspecified: Secondary | ICD-10-CM | POA: Diagnosis not present

## 2023-02-14 DIAGNOSIS — M810 Age-related osteoporosis without current pathological fracture: Secondary | ICD-10-CM

## 2023-02-14 DIAGNOSIS — Z720 Tobacco use: Secondary | ICD-10-CM

## 2023-02-14 DIAGNOSIS — Z1211 Encounter for screening for malignant neoplasm of colon: Secondary | ICD-10-CM

## 2023-02-14 DIAGNOSIS — I10 Essential (primary) hypertension: Secondary | ICD-10-CM | POA: Diagnosis not present

## 2023-02-14 DIAGNOSIS — F32A Depression, unspecified: Secondary | ICD-10-CM

## 2023-02-14 DIAGNOSIS — K219 Gastro-esophageal reflux disease without esophagitis: Secondary | ICD-10-CM

## 2023-02-14 DIAGNOSIS — M5431 Sciatica, right side: Secondary | ICD-10-CM

## 2023-02-14 LAB — LIPID PANEL
Cholesterol: 221 mg/dL — ABNORMAL HIGH (ref 0–200)
HDL: 65.8 mg/dL (ref >39.00)
LDL Cholesterol: 140 mg/dL — ABNORMAL HIGH (ref 0–99)
NonHDL: 155.2
Total CHOL/HDL Ratio: 3
Triglycerides: 78 mg/dL (ref 0.0–149.0)
VLDL: 15.6 mg/dL (ref 0.0–40.0)

## 2023-02-14 LAB — COMPREHENSIVE METABOLIC PANEL
ALT: 9 U/L (ref 0–35)
AST: 15 U/L (ref 0–37)
Albumin: 4.6 g/dL (ref 3.5–5.2)
Alkaline Phosphatase: 71 U/L (ref 39–117)
BUN: 12 mg/dL (ref 6–23)
CO2: 28 mEq/L (ref 19–32)
Calcium: 10.2 mg/dL (ref 8.4–10.5)
Chloride: 99 mEq/L (ref 96–112)
Creatinine, Ser: 0.78 mg/dL (ref 0.40–1.20)
GFR: 80 mL/min (ref >60.00)
Glucose, Bld: 96 mg/dL (ref 70–99)
Potassium: 4.1 mEq/L (ref 3.5–5.1)
Sodium: 136 mEq/L (ref 135–145)
Total Bilirubin: 0.9 mg/dL (ref 0.2–1.2)
Total Protein: 7 g/dL (ref 6.0–8.3)

## 2023-02-14 LAB — HEMOGLOBIN A1C: Hgb A1c MFr Bld: 5.9 % (ref 4.6–6.5)

## 2023-02-14 MED ORDER — METHOCARBAMOL 500 MG PO TABS
500.0000 mg | ORAL_TABLET | Freq: Every evening | ORAL | 2 refills | Status: DC | PRN
  Filled 2023-02-14: qty 30, 30d supply, fill #0
  Filled 2023-03-27: qty 30, 30d supply, fill #1

## 2023-02-14 MED ORDER — ESOMEPRAZOLE MAGNESIUM 40 MG PO CPDR
40.0000 mg | DELAYED_RELEASE_CAPSULE | Freq: Every day | ORAL | 1 refills | Status: AC | PRN
  Filled 2023-02-14: qty 90, 90d supply, fill #0

## 2023-02-14 MED ORDER — HYDROXYZINE PAMOATE 25 MG PO CAPS
25.0000 mg | ORAL_CAPSULE | Freq: Three times a day (TID) | ORAL | 0 refills | Status: DC | PRN
Start: 2023-02-14 — End: 2024-03-01
  Filled 2023-02-14: qty 30, 10d supply, fill #0

## 2023-02-14 MED ORDER — AMLODIPINE BESYLATE 10 MG PO TABS
10.0000 mg | ORAL_TABLET | Freq: Every day | ORAL | 1 refills | Status: DC
  Filled 2023-02-14 – 2023-07-03 (×2): qty 90, 90d supply, fill #0

## 2023-02-14 MED ORDER — METOPROLOL SUCCINATE ER 50 MG PO TB24
50.0000 mg | ORAL_TABLET | Freq: Every day | ORAL | 1 refills | Status: DC
  Filled 2023-02-14 – 2023-07-03 (×2): qty 90, 90d supply, fill #0

## 2023-02-14 MED ORDER — LOSARTAN POTASSIUM-HCTZ 100-25 MG PO TABS
1.0000 | ORAL_TABLET | Freq: Every day | ORAL | 1 refills | Status: DC
  Filled 2023-02-14 – 2023-07-03 (×2): qty 90, 90d supply, fill #0

## 2023-02-14 NOTE — Assessment & Plan Note (Signed)
Advised her against starting a Benzo.  Recommended prn hydroxyzine.  If needed we could try buspar.

## 2023-02-14 NOTE — Progress Notes (Signed)
Subjective:     Patient ID: Joy Olson, female    DOB: April 21, 1956, 67 y.o.   MRN: [id number]  Chief Complaint  Patient presents with   Follow-up    HPI  Discussed the use of AI scribe software for clinical note transcription with the patient, who gave verbal consent to proceed.  History of Present Illness         Anxiety/depression- reports that she tried her sister's clonazepam and it helped her anxiety and her sleep. She didn't like the way she felt on citalopram so she weaned herself off.  GERD- stable on nexium.  HTN- continues amlodipine and hyzaar.  Sciatica- continues to have intermittent pain.     Health Maintenance Due  Topic Date Due   Lung Cancer Screening  Never done   Colonoscopy  03/25/2021   COVID-19 Vaccine (5 - 2023-24 season) 04/15/2022   DEXA SCAN  12/25/2022    Past Medical History:  Diagnosis Date   Borderline diabetes 07/26/2016   Calculus of gallbladder without cholecystitis without obstruction 02/03/2020   Hypertension    Osteopenia 02/02/2016    Past Surgical History:  Procedure Laterality Date   CHOLECYSTECTOMY N/A 09/07/2020   Procedure: LAPAROSCOPIC CHOLECYSTECTOMY;  Surgeon: Diamantina Monks, MD;  Location: [facility] OR;  Service: General;  Laterality: N/A;   TUBAL LIGATION  1988    Family History  Problem Relation Age of Onset   Heart disease Mother        hx chf, died at age64   Heart disease Maternal Grandmother    Heart attack Maternal Grandfather    Colon cancer Neg Hx     Social History   Socioeconomic History   Marital status: Married    Spouse name: Not on file   Number of children: 2   Years of education: Not on file   Highest education level: 12th grade  Occupational History   Not on file  Tobacco Use   Smoking status: Every Day    Packs/day: 0.50    Years: 40.00    Additional pack years: 0.00    Total pack years: 20.00    Types: Cigarettes    Last attempt to quit: 01/27/2017    Years since quitting: 6.0    Smokeless tobacco: Never   Tobacco comments:    10-12 cigarettes a day  Vaping Use   Vaping Use: Never used  Substance and Sexual Activity   Alcohol use: Yes    Comment: occasional   Drug use: No   Sexual activity: Yes    Partners: Male  Other Topics Concern   Not on file  Social History Narrative   Son born 64 lives in [place]   Daughter 1988 live in [place]   3 grandchildren (2 boys and girl)   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   Married 38 years- husband is Scientist, research (physical sciences)   Enjoys working in the yard (gypsy garden) reading, watching movies   Grew up in [place]   Social Determinants of Health   Financial Resource Strain: Low Risk  (02/13/2023)   Overall Financial Resource Strain (CARDIA)    Difficulty of Paying Living Expenses: Not hard at all  Food Insecurity: No Food Insecurity (02/13/2023)   Hunger Vital Sign    Worried About Running Out of Food in the Last Year: Never true    Ran Out of Food in the Last Year: Never true  Transportation Needs: No Transportation  Needs (02/13/2023)   PRAPARE - Administrator, Civil Service (Medical): No    Lack of Transportation (Non-Medical): No  Physical Activity: Insufficiently Active (02/13/2023)   Exercise Vital Sign    Days of Exercise per Week: 3 days    Minutes of Exercise per Session: 10 min  Stress: No Stress Concern Present (02/13/2023)   [employer] of Occupational Health - Occupational Stress Questionnaire    Feeling of Stress : Not at all  Social Connections: Moderately Isolated (02/13/2023)   Social Connection and Isolation Panel [NHANES]    Frequency of Communication with Friends and Family: Twice a week    Frequency of Social Gatherings with Friends and Family: Once a week    Attends Religious Services: Never    Database administrator or Organizations: No    Attends Engineer, structural: Not on file    Marital Status: Married  Careers information officer Violence: Not on file    Outpatient Medications Prior to Visit  Medication Sig Dispense Refill   amLODipine (NORVASC) 10 MG tablet TAKE 1 TABLET (10 MG TOTAL) BY MOUTH DAILY. 90 tablet 1   citalopram (CELEXA) 20 MG tablet Take 1/2 tablet by mouth once daily for 1 week, then increase to a full tablet on week two. 30 tablet 0   esomeprazole (NEXIUM) 40 MG capsule Take 1 capsule (40 mg total) by mouth daily as needed. 90 capsule 1   losartan-hydrochlorothiazide (HYZAAR) 100-25 MG tablet TAKE 1 TABLET BY MOUTH DAILY. 90 tablet 1   methylPREDNISolone (MEDROL DOSEPAK) 4 MG TBPK tablet Take by mouth per package instructions 21 tablet 0   metoprolol succinate (TOPROL-XL) 50 MG 24 hr tablet TAKE 1 TABLET (50 MG TOTAL) BY MOUTH DAILY. TAKE WITH OR IMMEDIATELY FOLLOWING A MEAL. 90 tablet 1   Facility-Administered Medications Prior to Visit  Medication Dose Route Frequency Provider Last Rate Last Admin   0.9 %  sodium chloride infusion  500 mL Intravenous Continuous Danis, Starr [place] III, MD        No Known Allergies  ROS     Objective:    Physical Exam Constitutional:      General: She is not in acute distress.    Appearance: Normal appearance. She is well-developed.  HENT:     Head: Normocephalic and atraumatic.     Right Ear: External ear normal.     Left Ear: External ear normal.  Eyes:     General: No scleral icterus. Neck:     Thyroid: No thyromegaly.  Cardiovascular:     Rate and Rhythm: Normal rate and regular rhythm.     Heart sounds: Normal heart sounds. No murmur heard. Pulmonary:     Effort: Pulmonary effort is normal. No respiratory distress.     Breath sounds: Normal breath sounds. No wheezing.  Musculoskeletal:     Cervical back: Neck supple.  Skin:    General: Skin is warm and dry.  Neurological:     Mental Status: She is alert and oriented to person, place, and time.  Psychiatric:        Mood and Affect: Mood normal.        Behavior: Behavior normal.         Thought Content: Thought content normal.        Judgment: Judgment normal.      BP 130/74   Pulse 72   Resp 18   Ht 5\' 1"  (1.549 m)   Wt 114 lb (51.7 kg)  LMP 08/15/2009   SpO2 97%   BMI 21.54 kg/m  Wt Readings from Last 3 Encounters:  02/14/23 114 lb (51.7 kg)  09/02/22 118 lb (53.5 kg)  03/01/22 119 lb (54 kg)       Assessment & Plan:   Problem List Items Addressed This Visit       Unprioritized   Sciatica of right side    Offered PT referral. She declines for now. Noted some improvement in her pain when she took clonazepam.  Will give trial of robaxin HS PRN.       Relevant Medications   hydrOXYzine (VISTARIL) 25 MG capsule   methocarbamol (ROBAXIN) 500 MG tablet   Osteoporosis   Relevant Orders   DG Bone Density   HTN (hypertension) - Primary    BP Readings from Last 3 Encounters:  02/14/23 130/74  09/02/22 132/72  03/01/22 131/73  BP at goal. Continue amlodipine and hyzaar.       Relevant Medications   metoprolol succinate (TOPROL-XL) 50 MG 24 hr tablet   losartan-hydrochlorothiazide (HYZAAR) 100-25 MG tablet   amLODipine (NORVASC) 10 MG tablet   Other Relevant Orders   Comp Met (CMET) (Completed)   GERD (gastroesophageal reflux disease)    Stable on nexium. Continue same.       Relevant Medications   esomeprazole (NEXIUM) 40 MG capsule   Borderline diabetes    Check A1C.       Relevant Orders   HgB A1c (Completed)   Anxiety and depression    Advised her against starting a Benzo.  Recommended prn hydroxyzine.  If needed we could try buspar.        Relevant Medications   hydrOXYzine (VISTARIL) 25 MG capsule   Other Visit Diagnoses     Hyperlipidemia, unspecified hyperlipidemia type       Relevant Medications   metoprolol succinate (TOPROL-XL) 50 MG 24 hr tablet   losartan-hydrochlorothiazide (HYZAAR) 100-25 MG tablet   amLODipine (NORVASC) 10 MG tablet   Other Relevant Orders   Lipid panel (Completed)   Anxiety       Relevant  Medications   hydrOXYzine (VISTARIL) 25 MG capsule   Tobacco abuse       Relevant Orders   CT CHEST LUNG CA SCREEN LOW DOSE W/O CM   Screening for colon cancer       Relevant Orders   Ambulatory referral to Gastroenterology       I have discontinued Cala Bradford Swenson's methylPREDNISolone and citalopram. I am also having her start on hydrOXYzine and methocarbamol. Additionally, I am having her maintain her esomeprazole, metoprolol succinate, losartan-hydrochlorothiazide, and amLODipine. We will continue to administer sodium chloride.  Meds ordered this encounter  Medications   hydrOXYzine (VISTARIL) 25 MG capsule    Sig: Take 1 capsule (25 mg total) by mouth every 8 (eight) hours as needed.    Dispense:  30 capsule    Refill:  0    Order Specific Question:   Supervising Provider    Answer:   Danise Edge A [[id number]]   esomeprazole (NEXIUM) 40 MG capsule    Sig: Take 1 capsule (40 mg total) by mouth daily as needed.    Dispense:  90 capsule    Refill:  1    Please place on file    Order Specific Question:   Supervising Provider    Answer:   Danise Edge A [[id number]]   metoprolol succinate (TOPROL-XL) 50 MG 24 hr tablet    Sig: Take 1 tablet (  50 mg total) by mouth daily. Take with or immediately following a meal.    Dispense:  90 tablet    Refill:  1    Please place on file    Order Specific Question:   Supervising Provider    Answer:   Danise Edge A [[id number]]   losartan-hydrochlorothiazide (HYZAAR) 100-25 MG tablet    Sig: Take 1 tablet by mouth daily.    Dispense:  90 tablet    Refill:  1    Please place on file I    Order Specific Question:   Supervising Provider    Answer:   Danise Edge A [[id number]]   amLODipine (NORVASC) 10 MG tablet    Sig: Take 1 tablet (10 mg total) by mouth daily.    Dispense:  90 tablet    Refill:  1    Please place on file    Order Specific Question:   Supervising Provider    Answer:   Danise Edge A [[id number]]   methocarbamol (ROBAXIN) 500 MG tablet     Sig: Take 1 tablet (500 mg total) by mouth at bedtime as needed for muscle spasms.    Dispense:  30 tablet    Refill:  2    Order Specific Question:   Supervising Provider    Answer:   Danise Edge A [id number]

## 2023-02-14 NOTE — Assessment & Plan Note (Signed)
Offered PT referral. She declines for now. Noted some improvement in her pain when she took clonazepam.  Will give trial of robaxin HS PRN.

## 2023-02-14 NOTE — Telephone Encounter (Signed)
See mychart.  

## 2023-02-14 NOTE — Assessment & Plan Note (Signed)
BP Readings from Last 3 Encounters:  02/14/23 130/74  09/02/22 132/72  03/01/22 131/73   BP at goal. Continue amlodipine and hyzaar.

## 2023-02-14 NOTE — Assessment & Plan Note (Signed)
Stable on nexium.  Continue same.  

## 2023-02-14 NOTE — Assessment & Plan Note (Signed)
Check A1C 

## 2023-02-20 ENCOUNTER — Telehealth (HOSPITAL_BASED_OUTPATIENT_CLINIC_OR_DEPARTMENT_OTHER): Payer: Self-pay

## 2023-03-14 ENCOUNTER — Telehealth (HOSPITAL_BASED_OUTPATIENT_CLINIC_OR_DEPARTMENT_OTHER): Payer: Self-pay

## 2023-03-28 ENCOUNTER — Other Ambulatory Visit (HOSPITAL_BASED_OUTPATIENT_CLINIC_OR_DEPARTMENT_OTHER): Payer: Self-pay

## 2023-06-09 ENCOUNTER — Other Ambulatory Visit (HOSPITAL_BASED_OUTPATIENT_CLINIC_OR_DEPARTMENT_OTHER): Payer: Self-pay

## 2023-06-09 MED ORDER — INFLUENZA VIRUS VACC SPLIT PF (FLUZONE) 0.5 ML IM SUSY
0.5000 mL | PREFILLED_SYRINGE | Freq: Once | INTRAMUSCULAR | 0 refills | Status: AC
  Filled 2023-06-09: qty 0.5, 1d supply, fill #0

## 2023-07-04 ENCOUNTER — Other Ambulatory Visit (HOSPITAL_BASED_OUTPATIENT_CLINIC_OR_DEPARTMENT_OTHER): Payer: Self-pay

## 2024-03-01 ENCOUNTER — Encounter: Payer: Self-pay | Admitting: Family

## 2024-03-01 ENCOUNTER — Ambulatory Visit (INDEPENDENT_AMBULATORY_CARE_PROVIDER_SITE_OTHER): Payer: Self-pay | Admitting: Family

## 2024-03-01 VITALS — BP 170/65 | HR 74 | Temp 99.2°F | Resp 16 | Ht 61.0 in | Wt 115.0 lb

## 2024-03-01 DIAGNOSIS — Z720 Tobacco use: Secondary | ICD-10-CM

## 2024-03-01 DIAGNOSIS — E785 Hyperlipidemia, unspecified: Secondary | ICD-10-CM

## 2024-03-01 DIAGNOSIS — Z87891 Personal history of nicotine dependence: Secondary | ICD-10-CM

## 2024-03-01 DIAGNOSIS — Z0000 Encounter for general adult medical examination without abnormal findings: Secondary | ICD-10-CM | POA: Diagnosis not present

## 2024-03-01 DIAGNOSIS — Z1211 Encounter for screening for malignant neoplasm of colon: Secondary | ICD-10-CM

## 2024-03-01 DIAGNOSIS — M810 Age-related osteoporosis without current pathological fracture: Secondary | ICD-10-CM

## 2024-03-01 DIAGNOSIS — Z1231 Encounter for screening mammogram for malignant neoplasm of breast: Secondary | ICD-10-CM

## 2024-03-01 DIAGNOSIS — I10 Essential (primary) hypertension: Secondary | ICD-10-CM

## 2024-03-01 LAB — COMPREHENSIVE METABOLIC PANEL WITH GFR
AG Ratio: 1.7 (calc) (ref 1.0–2.5)
ALT: 10 U/L (ref 6–29)
AST: 15 U/L (ref 10–35)
Albumin: 4.5 g/dL (ref 3.6–5.1)
Alkaline phosphatase (APISO): 71 U/L (ref 37–153)
BUN: 11 mg/dL (ref 7–25)
CO2: 32 mmol/L (ref 20–32)
Calcium: 10.3 mg/dL (ref 8.6–10.4)
Chloride: 99 mmol/L (ref 98–110)
Creat: 0.71 mg/dL (ref 0.50–1.05)
Globulin: 2.7 g/dL (ref 1.9–3.7)
Glucose, Bld: 96 mg/dL (ref 65–99)
Potassium: 4.9 mmol/L (ref 3.5–5.3)
Sodium: 139 mmol/L (ref 135–146)
Total Bilirubin: 0.3 mg/dL (ref 0.2–1.2)
Total Protein: 7.2 g/dL (ref 6.1–8.1)
eGFR: 94 mL/min/1.73m2 (ref >=60)

## 2024-03-01 LAB — LIPID PANEL
Cholesterol: 230 mg/dL — ABNORMAL HIGH (ref <200)
HDL: 70 mg/dL (ref >=50)
LDL Cholesterol (Calc): 129 mg/dL — ABNORMAL HIGH
Non-HDL Cholesterol (Calc): 160 mg/dL — ABNORMAL HIGH (ref <130)
Total CHOL/HDL Ratio: 3.3 (calc) (ref <5.0)
Triglycerides: 178 mg/dL — ABNORMAL HIGH (ref <150)

## 2024-03-01 MED ORDER — LOSARTAN POTASSIUM-HCTZ 100-25 MG PO TABS
1.0000 | ORAL_TABLET | Freq: Every day | ORAL | 1 refills | Status: DC
Start: 2024-03-01 — End: 2024-03-21

## 2024-03-01 MED ORDER — METOPROLOL SUCCINATE ER 100 MG PO TB24
100.0000 mg | ORAL_TABLET | Freq: Every day | ORAL | 1 refills | Status: DC
Start: 2024-03-01 — End: 2024-03-15

## 2024-03-01 MED ORDER — AMLODIPINE BESYLATE 10 MG PO TABS: 10.0000 mg | ORAL_TABLET | Freq: Every day | ORAL | 1 refills | Status: DC

## 2024-03-01 NOTE — Progress Notes (Unsigned)
 Subjective:     Patient ID: Joy Olson, female    DOB: February 21, 1956, 68 y.o.   MRN: [id number]  Chief Complaint  Patient presents with   welcome to medicare    No concerns  Not fasting     HPI  Discussed the use of AI scribe software for clinical note transcription with the patient, who gave verbal consent to proceed.  History of Present Illness   Joy Olson is a 68 year old female who presents for a Welcome to Medicare wellness visit.  She is due for a colonoscopy, last performed in 2017, and a mammogram in 2023, which she prefers to have annually. She is willing to proceed with both screenings.  She has osteoporosis and is concerned about medication side effects, thus she is not taking any medication for it. She is considering postponing her bone density scan, which is due in 2022. No symptoms related to osteoporosis are present.  She is currently on metoprolol  50 mg, losartan  hydrochlorothiazide , and amlodipine . She plans to refill her prescriptions at a new pharmacy location due to insurance changes.  She smokes about half a pack of cigarettes a day and is trying to cut back. Her husband, who is also a smoker with emphysema, is supportive of her efforts to reduce smoking.  She has not had a pneumonia vaccine and is considering it, but prefers to wait. She is open to receiving a flu shot in October and continues to decline the shingles vaccine.    Health Maintenance Due  Topic Date Due   Medicare Annual Wellness (AWV)  Never done   Lung Cancer Screening  Never done   Zoster Vaccines- Shingrix (1 of 2) Never done   Colonoscopy  03/25/2021   COVID-19 Vaccine (5 - 2024-25 season) 04/16/2023   MAMMOGRAM  06/15/2023    Past Medical History:  Diagnosis Date   Borderline diabetes 07/26/2016   Calculus of gallbladder without cholecystitis without obstruction 02/03/2020   Hypertension    Osteopenia 02/02/2016    Past Surgical History:  Procedure Laterality  Date   CHOLECYSTECTOMY N/A 09/07/2020   Procedure: LAPAROSCOPIC CHOLECYSTECTOMY;  Surgeon: Paola Dreama SAILOR, MD;  Location: [facility] OR;  Service: General;  Laterality: N/A;   TUBAL LIGATION  1988    Family History  Problem Relation Age of Onset   Heart disease Mother        hx chf, died at age64   Heart disease Maternal Grandmother    Heart attack Maternal Grandfather    Colon cancer Neg Hx     Social History   Socioeconomic History   Marital status: Married    Spouse name: Not on file   Number of children: 2   Years of education: Not on file   Highest education level: 12th grade  Occupational History   Not on file  Tobacco Use   Smoking status: Every Day    Current packs/day: 0.00    Average packs/day: 0.5 packs/day for 40.0 years (20.0 ttl pk-yrs)    Types: Cigarettes    Start date: 01/27/1977    Last attempt to quit: 01/27/2017    Years since quitting: 7.0   Smokeless tobacco: Never   Tobacco comments:    10-12 cigarettes a day  Vaping Use   Vaping status: Never Used  Substance and Sexual Activity   Alcohol use: Yes    Comment: occasional   Drug use: No   Sexual activity: Yes    Partners: Male  Other Topics Concern  Not on file  Social History Narrative   Son born 1985 lives in [place]   Daughter 1988 live in [place]   3 grandchildren (2 boys and girl)   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   Married 38 years- husband is Scientist, research (physical sciences)   Enjoys working in the yard (gypsy garden) reading, watching movies   Grew up in [place]   Social Drivers of [employer]: Low Risk  (03/01/2024)   Overall Financial Resource Strain (CARDIA)    Difficulty of Paying Living Expenses: Not hard at all  Food Insecurity: No Food Insecurity (02/26/2024)   Hunger Vital Sign    Worried About Running Out of Food in the Last Year: Never true    Ran Out of Food in the Last Year: Never true  Transportation Needs:  No Transportation Needs (03/01/2024)   PRAPARE - Administrator, Civil Service (Medical): No    Lack of Transportation (Non-Medical): No  Physical Activity: Inactive (03/01/2024)   Exercise Vital Sign    Days of Exercise per Week: 0 days    Minutes of Exercise per Session: 0 min  Stress: No Stress Concern Present (03/01/2024)   [employer] of Occupational Health - Occupational Stress Questionnaire    Feeling of Stress: Not at all  Social Connections: Moderately Isolated (03/01/2024)   Social Connection and Isolation Panel    Frequency of Communication with Friends and Family: More than three times a week    Frequency of Social Gatherings with Friends and Family: Once a week    Attends Religious Services: Never    Database administrator or Organizations: No    Attends Banker Meetings: Never    Marital Status: Married  Catering manager Violence: Not At Risk (03/01/2024)   Humiliation, Afraid, Rape, and Kick questionnaire    Fear of Current or Ex-Partner: No    Emotionally Abused: No    Physically Abused: No    Sexually Abused: No    Outpatient Medications Prior to Visit  Medication Sig Dispense Refill   esomeprazole  (NEXIUM ) 40 MG capsule Take 1 capsule (40 mg total) by mouth daily as needed. 90 capsule 1   amLODipine  (NORVASC ) 10 MG tablet Take 1 tablet (10 mg total) by mouth daily. 90 tablet 1   losartan -hydrochlorothiazide  (HYZAAR ) 100-25 MG tablet Take 1 tablet by mouth daily. 90 tablet 1   metoprolol  succinate (TOPROL -XL) 50 MG 24 hr tablet Take 1 tablet (50 mg total) by mouth daily. Take with or immediately following a meal. 90 tablet 1   hydrOXYzine  (VISTARIL ) 25 MG capsule Take 1 capsule (25 mg total) by mouth every 8 (eight) hours as needed. (Patient not taking: Reported on 03/01/2024) 30 capsule 0   methocarbamol  (ROBAXIN ) 500 MG tablet Take 1 tablet (500 mg total) by mouth at bedtime as needed for muscle spasms. (Patient not taking: Reported on  03/01/2024) 30 tablet 2   0.9 %  sodium chloride  infusion      No facility-administered medications prior to visit.    No Known Allergies  Hearing Screening   500Hz  1000Hz  2000Hz  4000Hz   Right ear Pass Pass Pass Fail  Left ear Pass Pass Pass Pass   Vision Screening   Right eye Left eye Both eyes  Without correction     With correction 20/30 20/50       07/02/2015   10:10 AM 09/07/2020    6:27  AM 11/30/2021    8:11 AM 02/26/2024    3:50 PM 03/01/2024    2:24 PM  Fall Risk  Falls in the past year? No   0 0 0  Was there an injury with Fall?   0 0 0  Fall Risk Category Calculator   0 0  0  Fall Risk Category (Retired)   Low     (RETIRED) Patient Fall Risk Level  Moderate fall risk      Patient at Risk for Falls Due to     No Fall Risks  Fall risk Follow up     Falls evaluation completed     Patient-reported   Data saved with a previous flowsheet row definition      ROS     Objective:    Physical Exam Constitutional:      General: She is not in acute distress.    Appearance: Normal appearance. She is well-developed.  HENT:     Head: Normocephalic and atraumatic.     Right Ear: External ear normal.     Left Ear: External ear normal.  Eyes:     General: No scleral icterus. Neck:     Thyroid : No thyromegaly.  Cardiovascular:     Rate and Rhythm: Normal rate and regular rhythm.     Heart sounds: Normal heart sounds. No murmur heard. Pulmonary:     Effort: Pulmonary effort is normal. No respiratory distress.     Breath sounds: Normal breath sounds. No wheezing.  Musculoskeletal:     Cervical back: Neck supple.  Skin:    General: Skin is warm and dry.  Neurological:     Mental Status: She is alert and oriented to person, place, and time.  Psychiatric:        Mood and Affect: Mood normal.        Behavior: Behavior normal.        Thought Content: Thought content normal.        Judgment: Judgment normal.      BP (!) 170/65   Pulse 74   Temp 99.2 F (37.3  C) (Oral)   Resp 16   Ht 5' 1 (1.549 m)   Wt 115 lb (52.2 kg)   LMP 08/15/2009   SpO2 100%   BMI 21.73 kg/m  Wt Readings from Last 3 Encounters:  03/01/24 115 lb (52.2 kg)  02/14/23 114 lb (51.7 kg)  09/02/22 118 lb (53.5 kg)       Assessment & Plan:   Problem List Items Addressed This Visit       Unprioritized   Hyperlipidemia   Lab Results  Component Value Date   CHOL 230 (H) 03/01/2024   HDL 70 03/01/2024   LDLCALC 129 (H) 03/01/2024   TRIG 178 (H) 03/01/2024   CHOLHDL 3.3 03/01/2024   Update lipid panel- above goal. Advise low cholesterol diet, exercise.       Relevant Medications   losartan -hydrochlorothiazide  (HYZAAR ) 100-25 MG tablet   metoprolol  succinate (TOPROL -XL) 100 MG 24 hr tablet   amLODipine  (NORVASC ) 10 MG tablet   Other Relevant Orders   Comp Met (CMET) (Completed)   Lipid panel (Completed)   HTN (hypertension)   Uncontrolled.  Increase metoprolol  xl from 50mg  to 100mg .       Relevant Medications   losartan -hydrochlorothiazide  (HYZAAR ) 100-25 MG tablet   metoprolol  succinate (TOPROL -XL) 100 MG 24 hr tablet   amLODipine  (NORVASC ) 10 MG tablet   Other Relevant Orders   Comp Met (CMET) (  Completed)   Other Visit Diagnoses       Screening for colon cancer    -  Primary   Relevant Orders   Ambulatory referral to Gastroenterology     Breast cancer screening by mammogram       Relevant Orders   MM 3D SCREENING MAMMOGRAM BILATERAL BREAST     History of tobacco abuse       Relevant Orders   CT CHEST LUNG CA SCREEN LOW DOSE W/O CM       I have discontinued Joy Detjen's hydrOXYzine , metoprolol  succinate, and methocarbamol . I am also having her start on metoprolol  succinate. Additionally, I am having her maintain her esomeprazole , losartan -hydrochlorothiazide , and amLODipine . We will stop administering sodium chloride .  Meds ordered this encounter  Medications   losartan -hydrochlorothiazide  (HYZAAR ) 100-25 MG tablet    Sig: Take 1  tablet by mouth daily.    Dispense:  90 tablet    Refill:  1    Please place on file I    Supervising Provider:   DOMENICA BLACKBIRD A [[id number]]   metoprolol  succinate (TOPROL -XL) 100 MG 24 hr tablet    Sig: Take 1 tablet (100 mg total) by mouth daily. Take with or immediately following a meal.    Dispense:  90 tablet    Refill:  1    Supervising Provider:   DOMENICA BLACKBIRD A [[id number]]   amLODipine  (NORVASC ) 10 MG tablet    Sig: Take 1 tablet (10 mg total) by mouth daily.    Dispense:  90 tablet    Refill:  1    Please place on file    Supervising Provider:   DOMENICA BLACKBIRD A [[id number]]

## 2024-03-02 ENCOUNTER — Telehealth: Payer: Self-pay | Admitting: Family

## 2024-03-02 ENCOUNTER — Ambulatory Visit: Payer: Self-pay | Admitting: Family

## 2024-03-02 DIAGNOSIS — E785 Hyperlipidemia, unspecified: Secondary | ICD-10-CM | POA: Insufficient documentation

## 2024-03-02 NOTE — Assessment & Plan Note (Signed)
 Lab Results  Component Value Date   CHOL 230 (H) 03/01/2024   HDL 70 03/01/2024   LDLCALC 129 (H) 03/01/2024   TRIG 178 (H) 03/01/2024   CHOLHDL 3.3 03/01/2024   Update lipid panel- above goal. Advise low cholesterol diet, exercise.

## 2024-03-02 NOTE — Patient Instructions (Signed)
 VISIT SUMMARY:  During your Medicare wellness visit, we discussed your current health status, preventive screenings, and medication management. We addressed your hypertension, osteoporosis, and smoking cessation efforts, and planned for necessary screenings and vaccinations.  YOUR PLAN:  HYPERTENSION: Your blood pressure is elevated. -Increase metoprolol  to 100 mg daily. -Recheck blood pressure in two weeks with the nurse. -Refill losartan , amlodipine , and Nexium  prescriptions.  OSTEOPOROSIS: You are concerned about medication side effects and have no symptoms related to osteoporosis. -Postpone bone density scan.  SMOKING CESSATION: You smoke about half a pack daily and are trying to cut back with support from your husband. -Continue your efforts to reduce smoking.  GENERAL HEALTH MAINTENANCE: You are due for preventive screenings and vaccinations. -Order colonoscopy. -Order mammogram. -Administer flu shot in October. -Schedule lung cancer screening CT with mammogram.

## 2024-03-02 NOTE — Assessment & Plan Note (Signed)
 Declines dexa

## 2024-03-02 NOTE — Telephone Encounter (Signed)
 Opened in error

## 2024-03-02 NOTE — Assessment & Plan Note (Signed)
 Uncontrolled.  Increase metoprolol  xl from 50mg  to 100mg .

## 2024-03-05 ENCOUNTER — Encounter: Payer: Self-pay | Admitting: Family

## 2024-03-05 NOTE — Progress Notes (Deleted)
 Joy Olson

## 2024-03-05 NOTE — Progress Notes (Unsigned)
 Subjective:    Joy Olson is a 68 y.o. female who presents for a Welcome to Medicare exam.   Cardiac Risk Factors include: smoking/ tobacco exposure   Joy Olson is a 68 year old female who presents for a Welcome to [facility] wellness visit.  She is due for a colonoscopy, last performed in 2017, and a mammogram in 2023, which she prefers to have annually. She is willing to proceed with both screenings.  She has osteoporosis and is concerned about medication side effects, thus she is not taking any medication for it. She is considering postponing her bone density scan, which is due in 2022. No symptoms related to osteoporosis are present.  She is currently on metoprolol  50 mg, losartan  hydrochlorothiazide , and amlodipine . She plans to refill her prescriptions at a new pharmacy location due to insurance changes.  She smokes about half a pack of cigarettes a day and is trying to cut back. Her husband, who is also a smoker with emphysema, is supportive of her efforts to reduce smoking.  She has not had a pneumonia vaccine and is considering it, but prefers to wait. She is open to receiving a flu shot in October and continues to decline the shingles vaccine.      Objective:    Today's Vitals   03/01/24 1425 03/01/24 1516  BP: (!) 165/57 (!) 170/65  Pulse: 74   Resp: 16   Temp: 99.2 F (37.3 C)   TempSrc: Oral   SpO2: 100%   Weight: 115 lb (52.2 kg)   Height: 5' 1 (1.549 m)   Body mass index is 21.73 kg/m.  Medications Outpatient Encounter Medications as of 03/01/2024  Medication Sig   amLODipine  (NORVASC ) 10 MG tablet Take 1 tablet (10 mg total) by mouth daily.   esomeprazole  (NEXIUM ) 40 MG capsule Take 1 capsule (40 mg total) by mouth daily as needed.   losartan -hydrochlorothiazide  (HYZAAR ) 100-25 MG tablet Take 1 tablet by mouth daily.   metoprolol  succinate (TOPROL -XL) 100 MG 24 hr tablet Take 1 tablet (100 mg total) by mouth daily. Take with or immediately  following a meal.   [DISCONTINUED] amLODipine  (NORVASC ) 10 MG tablet Take 1 tablet (10 mg total) by mouth daily.   [DISCONTINUED] losartan -hydrochlorothiazide  (HYZAAR ) 100-25 MG tablet Take 1 tablet by mouth daily.   [DISCONTINUED] metoprolol  succinate (TOPROL -XL) 50 MG 24 hr tablet Take 1 tablet (50 mg total) by mouth daily. Take with or immediately following a meal.   [DISCONTINUED] hydrOXYzine  (VISTARIL ) 25 MG capsule Take 1 capsule (25 mg total) by mouth every 8 (eight) hours as needed. (Patient not taking: Reported on 03/01/2024)   [DISCONTINUED] methocarbamol  (ROBAXIN ) 500 MG tablet Take 1 tablet (500 mg total) by mouth at bedtime as needed for muscle spasms. (Patient not taking: Reported on 03/01/2024)   [DISCONTINUED] 0.9 %  sodium chloride  infusion    No facility-administered encounter medications on file as of 03/01/2024.     History: Past Medical History:  Diagnosis Date   Borderline diabetes 07/26/2016   Calculus of gallbladder without cholecystitis without obstruction 02/03/2020   Hypertension    Osteopenia 02/02/2016   Past Surgical History:  Procedure Laterality Date   CHOLECYSTECTOMY N/A 09/07/2020   Procedure: LAPAROSCOPIC CHOLECYSTECTOMY;  Surgeon: Paola Dreama SAILOR, MD;  Location: [facility] OR;  Service: General;  Laterality: N/A;   TUBAL LIGATION  1988    Family History  Problem Relation Age of Onset   Heart disease Mother        hx chf,  died at age64   Heart disease Maternal Grandmother    Heart attack Maternal Grandfather    Colon cancer Neg Hx    Social History   Occupational History   Not on file  Tobacco Use   Smoking status: Every Day    Current packs/day: 0.00    Average packs/day: 0.5 packs/day for 40.0 years (20.0 ttl pk-yrs)    Types: Cigarettes    Start date: 01/27/1977    Last attempt to quit: 01/27/2017    Years since quitting: 7.1   Smokeless tobacco: Never   Tobacco comments:    10-12 cigarettes a day  Vaping Use   Vaping status: Never Used   Substance and Sexual Activity   Alcohol use: Yes    Comment: occasional   Drug use: No   Sexual activity: Yes    Partners: Male    Tobacco Counseling Ready to quit: Not Answered Counseling given: Not Answered Tobacco comments: 10-12 cigarettes a day   Immunizations and Health Maintenance Immunization History  Administered Date(s) Administered   Influenza Split 06/01/2019   Influenza, Seasonal, Injecte, Preservative Fre 06/09/2023   Influenza,inj,Quad PF,6+ Mos 04/22/2016, 05/23/2017, 05/07/2021, 09/02/2022   Influenza-Unspecified 05/29/2020   PFIZER(Purple Top)SARS-COV-2 Vaccination 10/18/2019, 11/13/2019, 06/20/2020   Pfizer Covid-19 Vaccine Bivalent Booster 64yrs & up 05/20/2021   Tdap 07/25/2016   Health Maintenance Due  Topic Date Due   Lung Cancer Screening  Never done   Zoster Vaccines- Shingrix (1 of 2) Never done   Colonoscopy  03/25/2021   COVID-19 Vaccine (5 - 2024-25 season) 04/16/2023   MAMMOGRAM  06/15/2023    Activities of Daily Living    03/05/2024    4:10 PM 03/01/2024    2:27 PM  In your present state of health, do you have any difficulty performing the following activities:  Hearing? 0 0  Vision? 0 0  Difficulty concentrating or making decisions? 0 0  Walking or climbing stairs? 0 0  Dressing or bathing? 0 0  Doing errands, shopping? 0 0  Preparing Food and eating ?  N  Using the Toilet? N N  In the past six months, have you accidently leaked urine? N N  Do you have problems with loss of bowel control? N N  Managing your Medications? N N  Managing your Finances? N N  Housekeeping or managing your Housekeeping? N N    Physical Exam   Physical Exam (optional), or other factors deemed appropriate based on the beneficiary's medical and social history and current clinical standards.  Physical Exam  Constitutional: She is oriented to person, place, and time. She appears well-developed and well-nourished. No distress.  HENT:  Head: Normocephalic  and atraumatic.  Right Ear: Tympanic membrane and ear canal normal.  Left Ear: Tympanic membrane and ear canal normal.  Mouth/Throat: Oropharynx is clear and moist.  Eyes: Pupils are equal, round, and reactive to light. No scleral icterus.  Neck: Normal range of motion. No thyromegaly present.  Cardiovascular: Normal rate and regular rhythm.   No murmur heard. Pulmonary/Chest: Effort normal and breath sounds normal. No respiratory distress. He has no wheezes. She has no rales. She exhibits no tenderness.  Abdominal: Soft. Bowel sounds are normal. She exhibits no distension and no mass. There is no tenderness. There is no rebound and no guarding.  Musculoskeletal: She exhibits no edema.  Lymphadenopathy:    She has no cervical adenopathy.  Neurological: She is alert and oriented to person, place, and time. She has normal patellar reflexes.  She exhibits normal muscle tone. Coordination normal.  Skin: Skin is warm and dry.  Psychiatric: She has a normal mood and affect. Her behavior is normal. Judgment and thought content normal.  Breast/pelvic: deferred           Assessment & Plan:     Advanced Directives: Does Patient Have a Medical Advance Directive?: No Would patient like information on creating a medical advance directive?: Yes (MAU/Ambulatory/Procedural Areas - Information given)      Assessment:    This is a routine wellness examination for this patient .   Vision/Hearing screen Hearing Screening   500Hz  1000Hz  2000Hz  4000Hz   Right ear Pass Pass Pass Fail  Left ear Pass Pass Pass Pass   Vision Screening   Right eye Left eye Both eyes  Without correction     With correction 20/30 20/50      Goals   None     Depression Screen    03/05/2024    4:15 PM 03/01/2024    2:24 PM 02/14/2023    8:08 AM 11/30/2021    8:11 AM  PHQ 2/9 Scores  PHQ - 2 Score 0 0 0 0  PHQ- 9 Score 0 0       Fall Risk    03/05/2024    4:15 PM  Fall Risk   Falls in the past year? 0   Number falls in past yr: 0  Injury with Fall? 0  Risk for fall due to : No Fall Risks  Follow up Falls evaluation completed    Cognitive Function:    03/04/2024    4:16 PM 03/01/2024    2:39 PM  MMSE - Mini Mental State Exam  Orientation to time  5  Orientation to Place 5 5  Registration  3  Attention/ Calculation  4  Recall  3  Language- name 2 objects  2  Language- repeat  1  Language- follow 3 step command  3  Language- read & follow direction  1  Write a sentence  1  Copy design  1  Total score  29        03/01/2024    2:44 PM  6CIT Screen  What Year? 4 points  What month? 3 points  What time? 3 points  Count back from 20 4 points  Months in reverse 4 points  Repeat phrase 10 points  Total Score 28 points    Patient Care Team: Daryl Setter, NP as PCP - General (Internal Medicine)     Plan:     I have personally reviewed and noted the following in the patient's chart:   Medical and social history Use of alcohol, tobacco or illicit drugs  Current medications and supplements including opioid prescriptions. Patient is not currently taking opioid prescriptions. Functional ability and status Nutritional status Physical activity Advanced directives List of other physicians Hospitalizations, surgeries, and ER visits in previous 12 months Vitals Screenings to include cognitive, depression, and falls Referrals and appointments  In addition, I have reviewed and discussed with patient certain preventive protocols, quality metrics, and best practice recommendations. A written personalized care plan for preventive services as well as general preventive health recommendations were provided to patient.     Setter GORMAN Daryl, NP 03/06/2024

## 2024-03-15 ENCOUNTER — Ambulatory Visit: Admitting: Neurology

## 2024-03-15 VITALS — BP 136/78 | HR 48

## 2024-03-15 DIAGNOSIS — I10 Essential (primary) hypertension: Secondary | ICD-10-CM | POA: Diagnosis not present

## 2024-03-15 MED ORDER — METOPROLOL SUCCINATE ER 50 MG PO TB24: 75.0000 mg | ORAL_TABLET | Freq: Every day | ORAL | 0 refills | Status: DC

## 2024-03-15 MED ORDER — VALSARTAN-HYDROCHLOROTHIAZIDE 320-25 MG PO TABS: 1.0000 | ORAL_TABLET | Freq: Every day | ORAL | 0 refills | Status: DC

## 2024-03-15 NOTE — Progress Notes (Signed)
 Patient comes in today for blood pressure check.   Joy Olson is taking Amlodipine  10 mg 1 every day, Losartan -hydrochlorothiazide  100-25 mg 1 every day, and Metoprolol  XR 100 mg 1 every day (an increase from 50 mg at last visit) for blood pressure control. Joy Olson denies any missed doses, side effects, headaches, chest pain, palpitations, dizziness, or shortness of breath.   Joy Olson hasn't been checking blood pressure readings at home.  Her first blood pressure reading today is: 138/74. After sitting, her second reading is: 136/78.  However, heart rate reading is 48 with pulse oximeter and with a manual check.    I spoke with Melissa who advised stop Losartan -HCTZ. Start Valsartan HCT 320-25 mg daily. Decrease Metoprolol  to 50 mg - 1.5 tablets daily.    Prescriptions sent. Patient instructed. Appt scheduled.

## 2024-03-15 NOTE — Patient Instructions (Signed)
 Stop Losartan -HCTZ.  Start Valsartan HCT 320-25 mg daily.  Decrease Metoprolol  to 50 mg - 1.5 tablets daily.    Return in 1 week for repeat blood pressure check.

## 2024-03-21 ENCOUNTER — Ambulatory Visit (HOSPITAL_BASED_OUTPATIENT_CLINIC_OR_DEPARTMENT_OTHER)
Admission: RE | Admit: 2024-03-21 | Discharge: 2024-03-21 | Disposition: A | Discharge status: Home / Self Care | Source: Ambulatory Visit | Attending: Family | Admitting: Family

## 2024-03-21 ENCOUNTER — Ambulatory Visit: Admitting: Family Medicine

## 2024-03-21 VITALS — BP 128/80 | HR 69

## 2024-03-21 DIAGNOSIS — Z87891 Personal history of nicotine dependence: Secondary | ICD-10-CM | POA: Insufficient documentation

## 2024-03-21 DIAGNOSIS — I10 Essential (primary) hypertension: Secondary | ICD-10-CM | POA: Diagnosis not present

## 2024-03-21 DIAGNOSIS — Z1231 Encounter for screening mammogram for malignant neoplasm of breast: Secondary | ICD-10-CM | POA: Diagnosis present

## 2024-03-21 NOTE — Progress Notes (Signed)
 Pt here for Blood pressure check per Melissa  Pt currently takes: amlodipine  10mg , metoprolol  XL 50mg  (1.5 tablets daily), and valsartan -hydrochlorothiazide  320-25mg   At last OV on 03/15/24 metoprolol  XL lowered from 100mg  to 75mg  daily, and losartan - hydrochlorothiazide  100-25mg  d/c and valsartan  hydrochlorothiazide  320-25mg  started.    Pt reports compliance with medication.  BP today @ = 128/80 HR = 69  Pt advised per Dr. Watt- BP and HR look great. Follow-up as instructed by Melissa.

## 2024-03-28 ENCOUNTER — Encounter: Payer: Self-pay | Admitting: Family

## 2024-04-22 ENCOUNTER — Other Ambulatory Visit: Payer: Self-pay | Admitting: Family

## 2024-04-25 ENCOUNTER — Encounter: Payer: Self-pay | Admitting: Family

## 2024-04-25 DIAGNOSIS — I10 Essential (primary) hypertension: Secondary | ICD-10-CM

## 2024-04-25 MED ORDER — VALSARTAN-HYDROCHLOROTHIAZIDE 320-25 MG PO TABS: 1.0000 | ORAL_TABLET | Freq: Every day | ORAL | 0 refills | Status: AC

## 2024-04-25 MED ORDER — METOPROLOL SUCCINATE ER 25 MG PO TB24: 75.0000 mg | ORAL_TABLET | Freq: Every day | ORAL | 0 refills | Status: DC

## 2024-05-31 ENCOUNTER — Other Ambulatory Visit (HOSPITAL_BASED_OUTPATIENT_CLINIC_OR_DEPARTMENT_OTHER): Payer: Self-pay

## 2024-05-31 MED ORDER — FLUZONE HIGH-DOSE 0.5 ML IM SUSY
0.5000 mL | PREFILLED_SYRINGE | Freq: Once | INTRAMUSCULAR | 0 refills | Status: AC
  Filled 2024-05-31: qty 0.5, 1d supply, fill #0

## 2024-09-11 ENCOUNTER — Other Ambulatory Visit: Payer: Self-pay | Admitting: Family

## 2024-09-11 DIAGNOSIS — I10 Essential (primary) hypertension: Secondary | ICD-10-CM
